# Patient Record
Sex: Female | Born: 1950
Health system: Southern US, Community
[De-identification: ages and names within clinical notes are randomized; demographics above are authoritative.]

## PROBLEM LIST (undated history)

## (undated) DIAGNOSIS — N952 Postmenopausal atrophic vaginitis: Secondary | ICD-10-CM

## (undated) DIAGNOSIS — B379 Candidiasis, unspecified: Principal | ICD-10-CM

## (undated) DIAGNOSIS — L292 Pruritus vulvae: Secondary | ICD-10-CM

## (undated) DIAGNOSIS — E785 Hyperlipidemia, unspecified: Secondary | ICD-10-CM

## (undated) DIAGNOSIS — L290 Pruritus ani: Secondary | ICD-10-CM

## (undated) DIAGNOSIS — K219 Gastro-esophageal reflux disease without esophagitis: Secondary | ICD-10-CM

## (undated) DIAGNOSIS — I10 Essential (primary) hypertension: Secondary | ICD-10-CM

## (undated) HISTORY — DX: Hyperlipidemia, unspecified: E78.5

## (undated) HISTORY — PX: BUNIONECTOMY: SHX129

## (undated) HISTORY — DX: Pruritus ani: L29.0

## (undated) HISTORY — DX: Postmenopausal atrophic vaginitis: N95.2

## (undated) HISTORY — DX: Essential (primary) hypertension: I10

## (undated) HISTORY — DX: Pruritus vulvae: L29.2

## (undated) HISTORY — PX: CYSTOSTOMY W/ BLADDER BIOPSY: SHX1431

## (undated) HISTORY — PX: DIAGNOSTIC LAPAROSCOPY: SUR761

## (undated) HISTORY — DX: Gastro-esophageal reflux disease without esophagitis: K21.9

## (undated) HISTORY — DX: Candidiasis, unspecified: B37.9

---

## 2000-09-24 ENCOUNTER — Encounter: Payer: Self-pay | Admitting: *Deleted

## 2000-09-24 ENCOUNTER — Ambulatory Visit (HOSPITAL_COMMUNITY): Admission: RE | Admit: 2000-09-24 | Discharge: 2000-09-24 | Payer: Self-pay | Admitting: *Deleted

## 2000-09-27 ENCOUNTER — Ambulatory Visit (HOSPITAL_COMMUNITY): Admission: RE | Admit: 2000-09-27 | Discharge: 2000-09-27 | Payer: Self-pay | Admitting: *Deleted

## 2000-09-27 ENCOUNTER — Encounter: Payer: Self-pay | Admitting: *Deleted

## 2001-02-02 ENCOUNTER — Ambulatory Visit (HOSPITAL_COMMUNITY): Admission: RE | Admit: 2001-02-02 | Discharge: 2001-02-02 | Payer: Self-pay | Admitting: *Deleted

## 2001-02-02 ENCOUNTER — Encounter: Payer: Self-pay | Admitting: Specialist

## 2001-03-02 ENCOUNTER — Other Ambulatory Visit: Admission: RE | Admit: 2001-03-02 | Discharge: 2001-03-02 | Payer: Self-pay | Admitting: Obstetrics and Gynecology

## 2002-05-16 ENCOUNTER — Ambulatory Visit (HOSPITAL_COMMUNITY): Admission: RE | Admit: 2002-05-16 | Discharge: 2002-05-16 | Payer: Self-pay | Admitting: Specialist

## 2002-05-16 ENCOUNTER — Encounter: Payer: Self-pay | Admitting: Specialist

## 2003-05-09 ENCOUNTER — Ambulatory Visit (HOSPITAL_COMMUNITY): Admission: RE | Admit: 2003-05-09 | Discharge: 2003-05-09 | Payer: Self-pay | Admitting: Family Medicine

## 2003-08-29 ENCOUNTER — Ambulatory Visit (HOSPITAL_COMMUNITY): Admission: RE | Admit: 2003-08-29 | Discharge: 2003-08-29 | Payer: Self-pay | Admitting: Specialist

## 2003-12-21 ENCOUNTER — Ambulatory Visit (HOSPITAL_COMMUNITY): Admission: RE | Admit: 2003-12-21 | Discharge: 2003-12-21 | Payer: Self-pay | Admitting: Internal Medicine

## 2003-12-31 ENCOUNTER — Ambulatory Visit (HOSPITAL_COMMUNITY): Admission: RE | Admit: 2003-12-31 | Discharge: 2003-12-31 | Payer: Self-pay | Admitting: Otolaryngology

## 2004-09-02 ENCOUNTER — Ambulatory Visit (HOSPITAL_COMMUNITY): Admission: RE | Admit: 2004-09-02 | Discharge: 2004-09-02 | Payer: Self-pay | Admitting: Internal Medicine

## 2005-01-07 ENCOUNTER — Ambulatory Visit (HOSPITAL_COMMUNITY): Admission: RE | Admit: 2005-01-07 | Discharge: 2005-01-07 | Payer: Self-pay | Admitting: Specialist

## 2005-10-30 ENCOUNTER — Ambulatory Visit: Payer: Self-pay | Admitting: Podiatry

## 2006-04-05 ENCOUNTER — Ambulatory Visit (HOSPITAL_COMMUNITY): Admission: RE | Admit: 2006-04-05 | Discharge: 2006-04-05 | Payer: Self-pay | Admitting: Obstetrics & Gynecology

## 2006-07-30 ENCOUNTER — Ambulatory Visit (HOSPITAL_COMMUNITY): Admission: RE | Admit: 2006-07-30 | Discharge: 2006-07-30 | Payer: Self-pay | Admitting: Internal Medicine

## 2006-09-14 ENCOUNTER — Ambulatory Visit (HOSPITAL_COMMUNITY): Admission: RE | Admit: 2006-09-14 | Discharge: 2006-09-14 | Payer: Self-pay | Admitting: Internal Medicine

## 2007-09-07 ENCOUNTER — Ambulatory Visit (HOSPITAL_COMMUNITY): Admission: RE | Admit: 2007-09-07 | Discharge: 2007-09-07 | Payer: Self-pay | Admitting: Internal Medicine

## 2007-12-23 ENCOUNTER — Ambulatory Visit (HOSPITAL_BASED_OUTPATIENT_CLINIC_OR_DEPARTMENT_OTHER): Admission: RE | Admit: 2007-12-23 | Discharge: 2007-12-23 | Payer: Self-pay | Admitting: Urology

## 2007-12-23 ENCOUNTER — Encounter (INDEPENDENT_AMBULATORY_CARE_PROVIDER_SITE_OTHER): Payer: Self-pay | Admitting: Urology

## 2008-04-30 ENCOUNTER — Other Ambulatory Visit: Admission: RE | Admit: 2008-04-30 | Discharge: 2008-04-30 | Payer: Self-pay | Admitting: Obstetrics and Gynecology

## 2008-10-17 ENCOUNTER — Ambulatory Visit (HOSPITAL_COMMUNITY): Admission: RE | Admit: 2008-10-17 | Discharge: 2008-10-17 | Payer: Self-pay | Admitting: Internal Medicine

## 2009-09-25 ENCOUNTER — Other Ambulatory Visit: Admission: RE | Admit: 2009-09-25 | Discharge: 2009-09-25 | Payer: Self-pay | Admitting: Obstetrics and Gynecology

## 2009-10-15 ENCOUNTER — Ambulatory Visit (HOSPITAL_COMMUNITY): Admission: RE | Admit: 2009-10-15 | Discharge: 2009-10-15 | Payer: Self-pay | Admitting: Internal Medicine

## 2009-10-22 ENCOUNTER — Ambulatory Visit (HOSPITAL_COMMUNITY): Admission: RE | Admit: 2009-10-22 | Discharge: 2009-10-22 | Payer: Self-pay | Admitting: Obstetrics & Gynecology

## 2010-04-06 ENCOUNTER — Encounter: Payer: Self-pay | Admitting: Obstetrics & Gynecology

## 2010-04-06 ENCOUNTER — Encounter: Payer: Self-pay | Admitting: Specialist

## 2010-07-29 NOTE — Op Note (Signed)
[name], [name]          ACCOUNT NO.:  [id number]   MEDICAL RECORD NO.:  [id number]          PATIENT TYPE:  AMB   LOCATION:  [facility]                         FACILITY:  [facility]   PHYSICIAN:  Ronald L. Earlene Plater, M.D.  DATE OF BIRTH:  01-Sep-1950   DATE OF PROCEDURE:  12/23/2007  DATE OF DISCHARGE:                               OPERATIVE REPORT   DIAGNOSIS:  Chronic cystitis bladder lesions.   OPERATIVE PROCEDURE:  Cystourethroscopy, bladder biopsy.   SURGEON:  Gaynelle Arabian, M.D.   ANESTHESIA:  LMA.   BLOOD LOSS:  Negligible to none.   COMPLICATIONS:  None.   PROCEDURE:  Maria Werner is a lovely 60 year old white female who has  had recurrent urinary tract infections for many years with exacerbations  recently.  They do not appeared to be stress-related or related to  intercourse.  She has no other high-risk activity.  She has suprapubic  pain relieved by voiding, but it is variable and she has been on  prophylactic Macrodantin since she was seen in office.  On cystourethroscopy, she had a cobblestone appearance of the trigone  and diffusely throughout the bladder. We are suspicious cystitis from  chronic infections, it  was felt biopsy was indicated.  After  understanding risks, benefits and alternatives, she has elected to  proceed with the above procedure.   The patient was placed in the supine position.  After proper LMA  anesthesia. she was placed in the dorsal lithotomy position and prepped  and draped with Betadine in a sterile fashion.  The urethra was  calibrated to 24-French.  It was quite tight.  Cystourethroscopy was  performed.  Efflux of clear urine was noted from the normally placed  ureteral orifices bilaterally.  There was areas of cystitis glandularis  and a cobblestone appearance of the trigone fairly extensively and  diffusely throughout the bladder, although not as much concentration as  the midline trigone.  Utilizing cold cup biopsy forceps, the area  was  biopsied and submitted to Pathology and the base was cauterized with  Bugbee coagulation cautery.  The bladder was drained.  The panendoscope  was removed and the patient was taken to the recovery room stable.      Ronald L. Earlene Plater, M.D.  Electronically Signed     RLD/MEDQ  D:  12/23/2007  T:  12/23/2007  Job:  [id number]

## 2010-08-01 NOTE — Consult Note (Signed)
NAMEPIYA, MESCH          ACCOUNT NO.:  [id number]   MEDICAL RECORD NO.:  [id number]           PATIENT TYPE:   LOCATION:                                 FACILITY:   PHYSICIAN:  Lionel December, M.D.         DATE OF BIRTH:   DATE OF CONSULTATION:  DATE OF DISCHARGE:                                   CONSULTATION   REQUESTING PHYSICIAN:  Cyril Mourning with Dr. Lazaro Arms.   REASON FOR CONSULTATION:  Hemoccult-positive stool.   HISTORY OF PRESENT ILLNESS:  Mairin is a 60 year old Caucasian female who  recently had her yearly physical and was found to have hemoccult-positive  stool on rectal examination.  She does have intermittent small volume  hematochezia, especially on toilet tissue when she is constipated.  She rare  sees this, however.  Generally, her bowels move regularly.  She denies any  melena, abdominal pain, nausea, vomiting, heartburn, dysphagia, odynophagia.   MEDICATIONS:  She is on aspirin 81 mg daily.  She has had a chronic cough  for which she has been on several allergy medications with no results.  She  is going to get an antibiotic today; however, she has not picked her  prescription up yet.   PAST MEDICAL HISTORY:  She has never had a colonoscopy.   CURRENT MEDICATIONS:  1.  Guaifenesin one teaspoon daily.  2.  Astelin nasal spray.  3.  Centrum Silver, one daily.  4.  Citracal.  5.  Magnesium daily.  6.  Lisinopril 10/12.5 mg daily.  7.  Clarinex 5 mg daily.  8.  Lipitor 5 mg daily.  9.  Detrol LA.  10. Bayer aspirin 81 mg daily.   ALLERGIES:  No known drug allergies.   PAST MEDICAL HISTORY:  1.  Hypercholesterolemia.  2.  Hypertension.  3.  Chronic cough.  4.  Status post cesarean section and diagnostic laparoscopy secondary to      infertility issues over 25 years ago.   FAMILY HISTORY:  Father had diabetes mellitus and a history of prostate  cancer.  No family history of colorectal cancer.   SOCIAL HISTORY:  She is married and  has two children.  She is employed at  [employer].  She quit smoking 25 years ago, but only smoked for three to four  years.  No alcohol use.   REVIEW OF SYSTEMS:  Please see HPI for GI.  CARDIOPULMONARY:  In addition,  denies any chest pain.  GENITOURINARY:  Denies any dysuria or hematuria.   PHYSICAL EXAMINATION:  VITAL SIGNS:  Weight 142, height 5 feet, 2 inches.  Blood pressure 130/80, pulse 96.  GENERAL:  A pleasant, well-nourished, well-developed Caucasian female, in no  acute distress.  SKIN:  Warm and dry, no jaundice.  HEENT:  Conjunctivae are pink.  Sclerae are nonicteric.  Oropharynx mucosa  moist and pink.  No lesions, erythema or exudate.  No lymphadenopathy or  thyromegaly.  CHEST:  Lungs are clear to auscultation.  CARDIAC:  Examination reveals normal rate and rhythm, normal S1 and S2.  No  murmurs, rubs or gallops.  ABDOMEN:  Positive bowel sounds, soft, nontender, nondistended.  No  organomegaly or masses.  EXTREMITIES:  No edema.  RECTAL:  Examination by Cyril Mourning revealed heme-positive stool.  Hemorrhoid.   IMPRESSION:  Llana is a 60 year old lady who has had occasional  intermittent small volume hematochezia and recently found to have hemoccult  positive stool on rectal examination.  She needs to have a further  evaluation to rule out colorectal cancer or colonic polyps.   I discussed the risks, alternatives and benefits with the patient, and she  is agreeable to proceed.   PLAN:  Colonoscopy in the near future.      ________________________________________  Tana Coast, P.A.  ___________________________________________  Lionel December, M.D.    LL/MEDQ  D:  12/05/2003  T:  12/06/2003  Job:  [id number]   cc:   Tilda Burrow, M.D.  [address] [place]  [place] [address]  Fax: [phone]   Lazaro Arms, M.D.  [address], [address]  [place]  [place] [address]  Fax: [phone]

## 2010-08-01 NOTE — Op Note (Signed)
Maria Werner, DESMITH          ACCOUNT NO.:  [id number]   MEDICAL RECORD NO.:  [id number]          PATIENT TYPE:  AMB   LOCATION:  DAY                           FACILITY:  [facility]   PHYSICIAN:  Lionel December, M.D.    DATE OF BIRTH:  [birth year]   DATE OF PROCEDURE:  12/21/2003  DATE OF DISCHARGE:                                 OPERATIVE REPORT   PROCEDURE:  Total colonoscopy.   INDICATIONS:  Capri is a 60 year old Caucasian female who was recently  noted to have heme-positive stools.  She does not have any GI symptoms other  than occasional hematochezia when she is constipated.  She is on low-dose  ASA but has not had any epigastric pain.  The procedure risks were reviewed  with the patient, and informed consent was obtained.   PREMEDICATION:  Demerol 50 mg IV, Versed 5 mg IV in divided dose.   FINDINGS:  Procedure performed in endoscopy suite.  The patient's vital  signs and O2 saturation were monitored during procedure and remained stable.  The patient was placed in the left lateral recumbent position and rectal  examination performed.  No abnormality noted on external or digital exam.  The Olympus video scope was placed in the rectum and advanced under vision  in the sigmoid colon and beyond.  Preparation was satisfactory.  The scope  was passed to the cecum, which was identified by ileocecal valve and  appendiceal orifice.  Pictures taken for the record.  As the scope was  withdrawn, the colonic mucosa was once again carefully examined and was  normal throughout.  Rectal mucosa similarly was normal.  The scope was  retroflexed to examine anorectal junction, which was unremarkable.  The  endoscope was straightened and withdrawn.  The patient tolerated the  procedure well.   FINAL DIAGNOSIS:  Normal colonoscopy.   RECOMMENDATIONS:  She will resume her usual diet and medications.  She will  have her Hemoccult at the time of her physical exam, which will be in the  early part  of next year.     Naje   NR/MEDQ  D:  12/21/2003  T:  12/21/2003  Job:  [id number]   cc:   Lazaro Arms, M.D.  [address]  [place]  [place] [address]  Fax: [phone]   Cyril Mourning  Office of Dr. Fredirick Maudlin, M.D.  [address]  [place]  [place] [address]  Fax: [phone]

## 2010-10-28 ENCOUNTER — Other Ambulatory Visit (HOSPITAL_COMMUNITY): Payer: Self-pay | Admitting: Internal Medicine

## 2010-10-31 ENCOUNTER — Ambulatory Visit (HOSPITAL_COMMUNITY)
Admission: RE | Admit: 2010-10-31 | Discharge: 2010-10-31 | Disposition: A | Discharge status: Home / Self Care | Payer: BC Managed Care – PPO | Source: Ambulatory Visit | Attending: Internal Medicine | Admitting: Internal Medicine

## 2010-10-31 DIAGNOSIS — M818 Other osteoporosis without current pathological fracture: Secondary | ICD-10-CM | POA: Insufficient documentation

## 2010-10-31 DIAGNOSIS — Z780 Asymptomatic menopausal state: Secondary | ICD-10-CM | POA: Insufficient documentation

## 2010-11-19 ENCOUNTER — Other Ambulatory Visit: Payer: Self-pay | Admitting: Adult Health

## 2010-11-19 DIAGNOSIS — Z139 Encounter for screening, unspecified: Secondary | ICD-10-CM

## 2010-11-25 ENCOUNTER — Ambulatory Visit (HOSPITAL_COMMUNITY)
Admission: RE | Admit: 2010-11-25 | Discharge: 2010-11-25 | Disposition: A | Discharge status: Home / Self Care | Payer: BC Managed Care – PPO | Source: Ambulatory Visit | Attending: Adult Health | Admitting: Adult Health

## 2010-11-25 DIAGNOSIS — Z139 Encounter for screening, unspecified: Secondary | ICD-10-CM

## 2010-11-25 DIAGNOSIS — Z1231 Encounter for screening mammogram for malignant neoplasm of breast: Secondary | ICD-10-CM | POA: Insufficient documentation

## 2010-12-16 LAB — POCT I-STAT 4, (NA,K, GLUC, HGB,HCT): Sodium: 139

## 2011-01-06 ENCOUNTER — Other Ambulatory Visit (HOSPITAL_COMMUNITY)
Admission: RE | Admit: 2011-01-06 | Discharge: 2011-01-06 | Disposition: A | Discharge status: Home / Self Care | Payer: BC Managed Care – PPO | Source: Ambulatory Visit | Attending: Obstetrics and Gynecology | Admitting: Obstetrics and Gynecology

## 2011-01-06 ENCOUNTER — Other Ambulatory Visit: Payer: Self-pay | Admitting: Adult Health

## 2011-01-06 DIAGNOSIS — Z01419 Encounter for gynecological examination (general) (routine) without abnormal findings: Secondary | ICD-10-CM | POA: Insufficient documentation

## 2011-12-02 ENCOUNTER — Other Ambulatory Visit (HOSPITAL_COMMUNITY): Payer: Self-pay | Admitting: Internal Medicine

## 2011-12-02 DIAGNOSIS — Z139 Encounter for screening, unspecified: Secondary | ICD-10-CM

## 2011-12-08 ENCOUNTER — Ambulatory Visit (HOSPITAL_COMMUNITY): Payer: BC Managed Care – PPO

## 2011-12-14 ENCOUNTER — Ambulatory Visit (HOSPITAL_COMMUNITY)
Admission: RE | Admit: 2011-12-14 | Discharge: 2011-12-14 | Disposition: A | Discharge status: Home / Self Care | Payer: BC Managed Care – PPO | Source: Ambulatory Visit | Attending: Internal Medicine | Admitting: Internal Medicine

## 2011-12-14 DIAGNOSIS — Z139 Encounter for screening, unspecified: Secondary | ICD-10-CM

## 2011-12-14 DIAGNOSIS — Z1231 Encounter for screening mammogram for malignant neoplasm of breast: Secondary | ICD-10-CM | POA: Insufficient documentation

## 2012-09-28 ENCOUNTER — Ambulatory Visit (INDEPENDENT_AMBULATORY_CARE_PROVIDER_SITE_OTHER): Payer: BC Managed Care – PPO | Admitting: Adult Health

## 2012-09-28 ENCOUNTER — Encounter: Payer: Self-pay | Admitting: Adult Health

## 2012-09-28 VITALS — BP 140/80 | Ht 62.5 in | Wt 155.8 lb

## 2012-09-28 DIAGNOSIS — B379 Candidiasis, unspecified: Secondary | ICD-10-CM

## 2012-09-28 DIAGNOSIS — N952 Postmenopausal atrophic vaginitis: Secondary | ICD-10-CM

## 2012-09-28 DIAGNOSIS — L293 Anogenital pruritus, unspecified: Secondary | ICD-10-CM

## 2012-09-28 DIAGNOSIS — N898 Other specified noninflammatory disorders of vagina: Secondary | ICD-10-CM

## 2012-09-28 HISTORY — DX: Postmenopausal atrophic vaginitis: N95.2

## 2012-09-28 HISTORY — DX: Candidiasis, unspecified: B37.9

## 2012-09-28 LAB — POCT WET PREP (WET MOUNT)

## 2012-09-28 MED ORDER — NYSTATIN-TRIAMCINOLONE 100000-0.1 UNIT/GM-% EX CREA: TOPICAL_CREAM | Freq: Three times a day (TID) | CUTANEOUS | Status: DC

## 2012-09-28 MED ORDER — FLUCONAZOLE 150 MG PO TABS: 150.0000 mg | ORAL_TABLET | Freq: Once | ORAL | Status: DC

## 2012-09-28 NOTE — Progress Notes (Signed)
Subjective:     Patient ID: Gunnar Bulla, female   DOB: [birth year], 62 y.o.   MRN: [id number]  HPI Skai is a 62 year old white female in complaining of vaginal itching, she took antibiotics recently for a UTI.  Review of Systems Positives in HPI Reviewed past medical,surgical, social and family history. Reviewed medications and allergies.     Objective:   Physical Exam BP 140/80  Ht 5' 2.5" (1.588 m)  Wt 155 lb 12 oz (70.648 kg)  BMI 28.02 kg/m2   Skin warm and dry.Pelvic: external genitalia is normal in appearance for age, vagina: white discharge without odor and is atrophic in appearance, cervix:smooth , uterus: normal size, shape and contour, non tender, no masses felt, adnexa: no masses or tenderness noted. Wet prep: + for yeast.  Assessment:      Yeast  Vaginal discharge Atrophic vaginal tissue    Plan:      Rx diflucan 150 mg 1 now  Rx Mytrex cream to use 2-3 x daily prn  Try luvena for vaginal moisture Follow up prn

## 2012-09-28 NOTE — Patient Instructions (Addendum)
Follow up prn Monilial Vaginitis Vaginitis in a soreness, swelling and redness (inflammation) of the vagina and vulva. Monilial vaginitis is not a sexually transmitted infection. CAUSES  Yeast vaginitis is caused by yeast (candida) that is normally found in your vagina. With a yeast infection, the candida has overgrown in number to a point that upsets the chemical balance. SYMPTOMS   White, thick vaginal discharge.  Swelling, itching, redness and irritation of the vagina and possibly the lips of the vagina (vulva).  Burning or painful urination.  Painful intercourse. DIAGNOSIS  Things that may contribute to monilial vaginitis are:  Postmenopausal and virginal states.  Pregnancy.  Infections.  Being tired, sick or stressed, especially if you had monilial vaginitis in the past.  Diabetes. Good control will help lower the chance.  Birth control pills.  Tight fitting garments.  Using bubble bath, feminine sprays, douches or deodorant tampons.  Taking certain medications that kill germs (antibiotics).  Sporadic recurrence can occur if you become ill. TREATMENT  Your caregiver will give you medication.  There are several kinds of anti monilial vaginal creams and suppositories specific for monilial vaginitis. For recurrent yeast infections, use a suppository or cream in the vagina 2 times a week, or as directed.  Anti-monilial or steroid cream for the itching or irritation of the vulva may also be used. Get your caregiver's permission.  Painting the vagina with methylene blue solution may help if the monilial cream does not work.  Eating yogurt may help prevent monilial vaginitis. HOME CARE INSTRUCTIONS   Finish all medication as prescribed.  Do not have sex until treatment is completed or after your caregiver tells you it is okay.  Take warm sitz baths.  Do not douche.  Do not use tampons, especially scented ones.  Wear cotton underwear.  Avoid tight pants and  panty hose.  Tell your sexual partner that you have a yeast infection. They should go to their caregiver if they have symptoms such as mild rash or itching.  Your sexual partner should be treated as well if your infection is difficult to eliminate.  Practice safer sex. Use condoms.  Some vaginal medications cause latex condoms to fail. Vaginal medications that harm condoms are:  Cleocin cream.  Butoconazole (Femstat).  Terconazole (Terazol) vaginal suppository.  Miconazole (Monistat) (may be purchased over the counter). SEEK MEDICAL CARE IF:   You have a temperature by mouth above 102 F (38.9 C).  The infection is getting worse after 2 days of treatment.  The infection is not getting better after 3 days of treatment.  You develop blisters in or around your vagina.  You develop vaginal bleeding, and it is not your menstrual period.  You have pain when you urinate.  You develop intestinal problems.  You have pain with sexual intercourse. Document Released: 12/10/2004 Document Revised: 05/25/2011 Document Reviewed: 08/24/2008 [facility] Patient Information 2014 [place], [place].

## 2012-12-01 ENCOUNTER — Other Ambulatory Visit: Payer: Self-pay | Admitting: Adult Health

## 2012-12-01 DIAGNOSIS — Z139 Encounter for screening, unspecified: Secondary | ICD-10-CM

## 2012-12-15 ENCOUNTER — Ambulatory Visit (HOSPITAL_COMMUNITY)
Admission: RE | Admit: 2012-12-15 | Discharge: 2012-12-15 | Disposition: A | Discharge status: Home / Self Care | Payer: BC Managed Care – PPO | Source: Ambulatory Visit | Attending: Adult Health | Admitting: Adult Health

## 2012-12-15 DIAGNOSIS — Z1231 Encounter for screening mammogram for malignant neoplasm of breast: Secondary | ICD-10-CM | POA: Insufficient documentation

## 2012-12-15 DIAGNOSIS — Z139 Encounter for screening, unspecified: Secondary | ICD-10-CM

## 2013-01-09 ENCOUNTER — Ambulatory Visit (INDEPENDENT_AMBULATORY_CARE_PROVIDER_SITE_OTHER): Payer: BC Managed Care – PPO | Admitting: Adult Health

## 2013-01-09 ENCOUNTER — Encounter: Payer: Self-pay | Admitting: Adult Health

## 2013-01-09 ENCOUNTER — Other Ambulatory Visit (HOSPITAL_COMMUNITY)
Admission: RE | Admit: 2013-01-09 | Discharge: 2013-01-09 | Disposition: A | Discharge status: Home / Self Care | Payer: BC Managed Care – PPO | Source: Ambulatory Visit | Attending: Adult Health | Admitting: Adult Health

## 2013-01-09 VITALS — BP 122/76 | HR 72 | Ht 62.0 in | Wt 153.0 lb

## 2013-01-09 DIAGNOSIS — Z01419 Encounter for gynecological examination (general) (routine) without abnormal findings: Secondary | ICD-10-CM

## 2013-01-09 DIAGNOSIS — Z1151 Encounter for screening for human papillomavirus (HPV): Secondary | ICD-10-CM | POA: Insufficient documentation

## 2013-01-09 NOTE — Progress Notes (Signed)
Patient ID: Maria Werner, female   DOB: 10/12/1950, 62 y.o.   MRN: [id number] History of Present Illness: Maria Werner is a 62 year old white female married in for a pap and physical.No complaints today.   Current Medications, Allergies, Past Medical History, Past Surgical History, Family History and Social History were reviewed in [employer] record.   Past Medical History  Diagnosis Date  . Hypertension   . GERD (gastroesophageal reflux disease)   . Hyperlipidemia   . Yeast infection 09/28/2012  . Post-menopausal atrophic vaginitis 09/28/2012  Current outpatient prescriptions:aspirin 81 MG tablet, Take 81 mg by mouth every other day., Disp: , Rfl: ;  atorvastatin (LIPITOR) 10 MG tablet, Take 10 mg by mouth at bedtime., Disp: , Rfl: ;  hydrochlorothiazide (HYDRODIURIL) 25 MG tablet, Take 25 mg by mouth daily. , Disp: , Rfl:  ibandronate (BONIVA) 150 MG tablet, Take 150 mg by mouth every 30 (thirty) days. Take in the morning with a full glass of water, on an empty stomach, and do not take anything else by mouth or lie down for the next 30 min., Disp: , Rfl: ;  KLOR-CON M20 20 MEQ tablet, Take 20 mEq by mouth daily. , Disp: , Rfl: ;  losartan (COZAAR) 50 MG tablet, Take 50 mg by mouth daily. , Disp: , Rfl:  Multiple Vitamins-Minerals (CENTRUM SILVER PO), Take 1 tablet by mouth daily., Disp: , Rfl: ;  omeprazole (PRILOSEC) 20 MG capsule, Take 20 mg by mouth daily. , Disp: , Rfl: ;  fluconazole (DIFLUCAN) 150 MG tablet, Take 1 tablet (150 mg total) by mouth once., Disp: 2 tablet, Rfl: 1;  nystatin-triamcinolone (MYCOLOG II) cream, Apply topically 3 (three) times daily., Disp: 30 g, Rfl: 1  Review of Systems: Patient denies any headaches, blurred vision, shortness of breath, chest pain, abdominal pain, problems with bowel movements, urination, or intercourse, except dry. No joint swelling or mood changes.    Physical Exam:BP 122/76  Pulse 72  Ht 5\' 2"  (1.575 m)  Wt 153 lb  (69.4 kg)  BMI 27.98 kg/m2 General:  Well developed, well nourished, no acute distress Skin:  Warm and dry,tan Neck:  Midline trachea, normal thyroid Lungs; Clear to auscultation bilaterally Breast:  No dominant palpable mass, retraction, or nipple discharge Cardiovascular: Regular rate and rhythm Abdomen:  Soft, non tender, no hepatosplenomegaly Pelvic:  External genitalia is normal in appearance.  The vagina is normal in appearance for age.               The cervix is atrophic, pap with HPV.  Uterus is felt to be normal size, shape, and contour.  No                adnexal masses or tenderness noted. Rectal: Good sphincter tone, no polyps, or hemorrhoids felt.  Hemoccult negative. Extremities:  No swelling or varicosities noted Psych:  No mood changes, alert and cooperative   Impression: Yearly gyn exam    Plan: Physical in 1 year Mammogram yearly Colonoscopy 2015 Labs with PCP Pap in 3 years if this one normal. Call prn problems

## 2013-01-09 NOTE — Patient Instructions (Signed)
Physical in 1 year Mammogram yearly  colonoscopy 2015

## 2014-01-15 ENCOUNTER — Encounter: Payer: Self-pay | Admitting: Adult Health

## 2014-01-23 ENCOUNTER — Other Ambulatory Visit: Payer: Self-pay | Admitting: Adult Health

## 2014-01-23 DIAGNOSIS — Z1231 Encounter for screening mammogram for malignant neoplasm of breast: Secondary | ICD-10-CM

## 2014-01-29 ENCOUNTER — Ambulatory Visit (HOSPITAL_COMMUNITY)
Admission: RE | Admit: 2014-01-29 | Discharge: 2014-01-29 | Disposition: A | Discharge status: Home / Self Care | Payer: BC Managed Care – PPO | Source: Ambulatory Visit | Attending: Adult Health | Admitting: Adult Health

## 2014-01-29 DIAGNOSIS — Z1231 Encounter for screening mammogram for malignant neoplasm of breast: Secondary | ICD-10-CM | POA: Diagnosis not present

## 2014-01-30 ENCOUNTER — Encounter (INDEPENDENT_AMBULATORY_CARE_PROVIDER_SITE_OTHER): Payer: Self-pay | Admitting: *Deleted

## 2014-02-13 HISTORY — PX: CARPAL TUNNEL RELEASE: SHX101

## 2014-05-24 ENCOUNTER — Encounter (INDEPENDENT_AMBULATORY_CARE_PROVIDER_SITE_OTHER): Payer: Self-pay | Admitting: *Deleted

## 2014-05-24 ENCOUNTER — Other Ambulatory Visit (INDEPENDENT_AMBULATORY_CARE_PROVIDER_SITE_OTHER): Payer: Self-pay | Admitting: *Deleted

## 2014-05-24 DIAGNOSIS — Z1211 Encounter for screening for malignant neoplasm of colon: Secondary | ICD-10-CM

## 2014-06-15 ENCOUNTER — Telehealth (INDEPENDENT_AMBULATORY_CARE_PROVIDER_SITE_OTHER): Payer: Self-pay | Admitting: *Deleted

## 2014-06-15 DIAGNOSIS — Z1211 Encounter for screening for malignant neoplasm of colon: Secondary | ICD-10-CM

## 2014-06-15 MED ORDER — PEG 3350-KCL-NA BICARB-NACL 420 G PO SOLR: 4000.0000 mL | Freq: Once | ORAL | Status: DC

## 2014-06-15 NOTE — Telephone Encounter (Signed)
Patient needs trilyte 

## 2014-07-03 ENCOUNTER — Telehealth (INDEPENDENT_AMBULATORY_CARE_PROVIDER_SITE_OTHER): Payer: Self-pay | Admitting: *Deleted

## 2014-07-03 NOTE — Telephone Encounter (Signed)
agree

## 2014-07-03 NOTE — Telephone Encounter (Signed)
Referring MD/PCP: fagan   Procedure: tcs  Reason/Indication:  screening  Has patient had this procedure before?  Yes, 2005 -- epic  If so, when, by whom and where?    Is there a family history of colon cancer?  no  Who?  What age when diagnosed?    Is patient diabetic?   no      Does patient have prosthetic heart valve?  no  Do you have a pacemaker?  no  Has patient ever had endocarditis? no  Has patient had joint replacement within last 12 months?  no  Does patient tend to be constipated or take laxatives? no  Is patient on Coumadin, Plavix and/or Aspirin? yes  Medications: asa 81 mg daily, losartan 50 mg daily, potassium 20 meq daily, omeprazole 20 mg daily, atorvastatin 20 mg daily, hctz 25 mg daily, vit c, centrum silver  Allergies: nkda  Medication Adjustment: asa 2 days  Procedure date & time: 08/01/14 at 930

## 2014-08-01 ENCOUNTER — Encounter (HOSPITAL_COMMUNITY): Payer: Self-pay | Admitting: *Deleted

## 2014-08-01 ENCOUNTER — Encounter (HOSPITAL_COMMUNITY)
Admission: RE | Disposition: A | Discharge status: Home / Self Care | Payer: Self-pay | Source: Ambulatory Visit | Attending: Internal Medicine

## 2014-08-01 ENCOUNTER — Ambulatory Visit (HOSPITAL_COMMUNITY)
Admission: RE | Admit: 2014-08-01 | Discharge: 2014-08-01 | Disposition: A | Discharge status: Home / Self Care | Payer: Commercial Managed Care - HMO | Source: Ambulatory Visit | Attending: Internal Medicine | Admitting: Internal Medicine

## 2014-08-01 DIAGNOSIS — D125 Benign neoplasm of sigmoid colon: Secondary | ICD-10-CM | POA: Insufficient documentation

## 2014-08-01 DIAGNOSIS — Z79899 Other long term (current) drug therapy: Secondary | ICD-10-CM | POA: Diagnosis not present

## 2014-08-01 DIAGNOSIS — D123 Benign neoplasm of transverse colon: Secondary | ICD-10-CM | POA: Diagnosis not present

## 2014-08-01 DIAGNOSIS — Z87891 Personal history of nicotine dependence: Secondary | ICD-10-CM | POA: Insufficient documentation

## 2014-08-01 DIAGNOSIS — Z7982 Long term (current) use of aspirin: Secondary | ICD-10-CM | POA: Diagnosis not present

## 2014-08-01 DIAGNOSIS — K5730 Diverticulosis of large intestine without perforation or abscess without bleeding: Secondary | ICD-10-CM | POA: Insufficient documentation

## 2014-08-01 DIAGNOSIS — Z1211 Encounter for screening for malignant neoplasm of colon: Secondary | ICD-10-CM | POA: Diagnosis not present

## 2014-08-01 DIAGNOSIS — E785 Hyperlipidemia, unspecified: Secondary | ICD-10-CM | POA: Diagnosis not present

## 2014-08-01 DIAGNOSIS — I10 Essential (primary) hypertension: Secondary | ICD-10-CM | POA: Insufficient documentation

## 2014-08-01 DIAGNOSIS — K219 Gastro-esophageal reflux disease without esophagitis: Secondary | ICD-10-CM | POA: Insufficient documentation

## 2014-08-01 HISTORY — PX: COLONOSCOPY: SHX5424

## 2014-08-01 SURGERY — COLONOSCOPY
Anesthesia: Moderate Sedation

## 2014-08-01 MED ORDER — STERILE WATER FOR IRRIGATION IR SOLN
Status: DC | PRN
  Administered 2014-08-01: 09:00:00

## 2014-08-01 MED ORDER — MEPERIDINE HCL 50 MG/ML IJ SOLN
INTRAMUSCULAR | Status: AC
  Filled 2014-08-01: qty 1

## 2014-08-01 MED ORDER — MIDAZOLAM HCL 5 MG/5ML IJ SOLN
INTRAMUSCULAR | Status: DC | PRN
  Administered 2014-08-01: 1 mg via INTRAVENOUS
  Administered 2014-08-01: 2 mg via INTRAVENOUS
  Administered 2014-08-01 (×2): 1 mg via INTRAVENOUS
  Administered 2014-08-01: 2 mg via INTRAVENOUS
  Administered 2014-08-01: 1 mg via INTRAVENOUS
  Administered 2014-08-01: 2 mg via INTRAVENOUS

## 2014-08-01 MED ORDER — MEPERIDINE HCL 50 MG/ML IJ SOLN
INTRAMUSCULAR | Status: DC | PRN
  Administered 2014-08-01 (×2): 25 mg via INTRAVENOUS

## 2014-08-01 MED ORDER — SODIUM CHLORIDE 0.9 % IV SOLN
INTRAVENOUS | Status: DC
  Administered 2014-08-01: 09:00:00 via INTRAVENOUS

## 2014-08-01 MED ORDER — MIDAZOLAM HCL 5 MG/5ML IJ SOLN
INTRAMUSCULAR | Status: AC
  Filled 2014-08-01: qty 10

## 2014-08-01 NOTE — Op Note (Signed)
COLONOSCOPY PROCEDURE REPORT  PATIENT:  Maria Werner  MR#:  [id number] Birthdate:  10/14/1950, 64 y.o., female Endoscopist:  Dr. Rogene Houston, MD Referred By:  Dr. Asencion Noble, MD Procedure Date: 08/01/2014  Procedure:   Colonoscopy  Indications:  Patient is 64 year old Caucasian female was undergoing average risk screening colonoscopy. Her last exam was normal in October 2005.  Informed Consent:  The procedure and risks were reviewed with the patient and informed consent was obtained.  Medications:  Demerol 50 mg IV Versed 10 mg IV  Description of procedure:  After a digital rectal exam was performed, that colonoscope was advanced from the anus through the rectum and colon to the area of the cecum, ileocecal valve and appendiceal orifice. The cecum was deeply intubated. These structures were well-seen and photographed for the record. From the level of the cecum and ileocecal valve, the scope was slowly and cautiously withdrawn. The mucosal surfaces were carefully surveyed utilizing scope tip to flexion to facilitate fold flattening as needed. The scope was pulled down into the rectum where a thorough exam including retroflexion was performed.  Findings:   Prep excellent. Small polyp was ablated via cold biopsy from transverse colon and submitted with another small polyp that was removed with cold biopsy forceps distal sigmoid colon. 5 mm polyp cold snare from mid sigmoid colon. Few small diverticula at sigmoid colon. Normal rectal mucosa and anal rectal junction.   Therapeutic/Diagnostic Maneuvers Performed:  See above  Complications:  None  Cecal Withdrawal Time:  25  minutes  Impression:  Examination performed to cecum. Two small polyps ablated via cold biopsy and submitted together(transverse and sigmoid colon). 5 mm polyp cold snare from mid sigmoid colon. Mild sigmoid colon diverticulosis.  Recommendations:  Standard instructions given. I will contact patient with  biopsy results and further recommendations.  Aluel Schwarz U  08/01/2014 10:25 AM  CC: Dr. Asencion Noble, MD & Dr. Rayne Du ref. provider found

## 2014-08-01 NOTE — H&P (Signed)
Maria Werner is an 64 y.o. female.   Chief Complaint: Patient is here for colonoscopy. HPI: Patient is 64 year old Caucasian female who is here for screening colonoscopy. Last exam was in October 2005 and was normal. She denies abdominal pain change in bowel habits or rectal bleeding. Family  history is negative for CRC.  Past Medical History  Diagnosis Date  . Hypertension   . GERD (gastroesophageal reflux disease)   . Hyperlipidemia   . Yeast infection 09/28/2012  . Post-menopausal atrophic vaginitis 09/28/2012    Past Surgical History  Procedure Laterality Date  . Cesarean section    . Bunionectomy    . Cystostomy w/ bladder biopsy    . Diagnostic laparoscopy      Family History  Problem Relation Age of Onset  . COPD Mother     emphysema  . Heart disease Father   . Cancer Father     prostate  . Diabetes Father   . Diabetes Sister   . Cancer - Colon Neg Hx    Social History:  reports that she has quit smoking. She has never used smokeless tobacco. She reports that she does not drink alcohol or use illicit drugs.  Allergies: No Known Allergies  Medications Prior to Admission  Medication Sig Dispense Refill  . aspirin EC 81 MG tablet Take 81 mg by mouth daily.    Marland Kitchen atorvastatin (LIPITOR) 10 MG tablet Take 10 mg by mouth at bedtime.    . hydrochlorothiazide (HYDRODIURIL) 25 MG tablet Take 25 mg by mouth daily.     Marland Kitchen KLOR-CON M20 20 MEQ tablet Take 20 mEq by mouth daily.     Marland Kitchen losartan (COZAAR) 50 MG tablet Take 50 mg by mouth daily.     . Multiple Vitamins-Minerals (CENTRUM SILVER PO) Take 1 tablet by mouth daily.    Marland Kitchen omeprazole (PRILOSEC) 20 MG capsule Take 20 mg by mouth daily.     . polyethylene glycol-electrolytes (NULYTELY/GOLYTELY) 420 G solution Take 4,000 mLs by mouth once. 4000 mL 0  . fluconazole (DIFLUCAN) 150 MG tablet Take 1 tablet (150 mg total) by mouth once. (Patient not taking: Reported on 07/27/2014) 2 tablet 1  . ibandronate (BONIVA) 150 MG tablet  Take 150 mg by mouth every 30 (thirty) days. Take in the morning with a full glass of water, on an empty stomach, and do not take anything else by mouth or lie down for the next 30 min.    . nystatin-triamcinolone (MYCOLOG II) cream Apply topically 3 (three) times daily. (Patient not taking: Reported on 07/27/2014) 30 g 1    No results found for this or any previous visit (from the past 48 hour(s)). No results found.  ROS  Blood pressure 133/72, pulse 83, temperature 97.6 F (36.4 C), temperature source Oral, resp. rate 16, height 5\' 2"  (1.575 m), weight 154 lb (69.854 kg), SpO2 96 %. Physical Exam  Constitutional: She appears well-developed and well-nourished.  HENT:  Mouth/Throat: Oropharynx is clear and moist.  Eyes: Conjunctivae are normal. No scleral icterus.  Neck: No thyromegaly present.  Cardiovascular: Normal rate, regular rhythm and normal heart sounds.   No murmur heard. Respiratory: Effort normal and breath sounds normal.  GI: Soft. She exhibits no distension and no mass. There is no tenderness.  Musculoskeletal: She exhibits no edema.  Lymphadenopathy:    She has no cervical adenopathy.  Neurological: She is alert.  Skin: Skin is warm and dry.     Assessment/Plan Average risk screening colonoscopy.  Angelo Caroll  U 08/01/2014, 9:26 AM

## 2014-08-01 NOTE — Discharge Instructions (Signed)
Resume usual medications and high fiber diet. °No driving for 24 hours. °Physician will call with biopsy results ° ° °High-Fiber Diet °Fiber is found in fruits, vegetables, and grains. A high-fiber diet encourages the addition of more whole grains, legumes, fruits, and vegetables in your diet. The recommended amount of fiber for adult males is 38 g per day. For adult females, it is 25 g per day. Pregnant and lactating women should get 28 g of fiber per day. If you have a digestive or bowel problem, ask your caregiver for advice before adding high-fiber foods to your diet. Eat a variety of high-fiber foods instead of only a select few type of foods.  °PURPOSE °· To increase stool bulk. °· To make bowel movements more regular to prevent constipation. °· To lower cholesterol. °· To prevent overeating. °WHEN IS THIS DIET USED? °· It may be used if you have constipation and hemorrhoids. °· It may be used if you have uncomplicated diverticulosis (intestine condition) and irritable bowel syndrome. °· It may be used if you need help with weight management. °· It may be used if you want to add it to your diet as a protective measure against atherosclerosis, diabetes, and cancer. °SOURCES OF FIBER °· Whole-grain breads and cereals. °· Fruits, such as apples, oranges, bananas, berries, prunes, and pears. °· Vegetables, such as green peas, carrots, sweet potatoes, beets, broccoli, cabbage, spinach, and artichokes. °· Legumes, such split peas, soy, lentils. °· Almonds. °FIBER CONTENT IN FOODS °Starches and Grains / Dietary Fiber (g) °· Cheerios, 1 cup / 3 g °· Corn Flakes cereal, 1 cup / 0.7 g °· Rice crispy treat cereal, 1¼ cup / 0.3 g °· Instant oatmeal (cooked), ½ cup / 2 g °· Frosted wheat cereal, 1 cup / 5.1 g °· Brown, long-grain rice (cooked), 1 cup / 3.5 g °· White, long-grain rice (cooked), 1 cup / 0.6 g °· Enriched macaroni (cooked), 1 cup / 2.5 g °Legumes / Dietary Fiber (g) °· Baked beans (canned, plain, or  vegetarian), ½ cup / 5.2 g °· Kidney beans (canned), ½ cup / 6.8 g °· Pinto beans (cooked), ½ cup / 5.5 g °Breads and Crackers / Dietary Fiber (g) °· Plain or honey graham crackers, 2 squares / 0.7 g °· Saltine crackers, 3 squares / 0.3 g °· Plain, salted pretzels, 10 pieces / 1.8 g °· Whole-wheat bread, 1 slice / 1.9 g °· White bread, 1 slice / 0.7 g °· Raisin bread, 1 slice / 1.2 g °· Plain bagel, 3 oz / 2 g °· Flour tortilla, 1 oz / 0.9 g °· Corn tortilla, 1 small / 1.5 g °· Hamburger or hotdog bun, 1 small / 0.9 g °Fruits / Dietary Fiber (g) °· Apple with skin, 1 medium / 4.4 g °· Sweetened applesauce, ½ cup / 1.5 g °· Banana, ½ medium / 1.5 g °· Grapes, 10 grapes / 0.4 g °· Orange, 1 small / 2.3 g °· Raisin, 1.5 oz / 1.6 g °· Melon, 1 cup / 1.4 g °Vegetables / Dietary Fiber (g) °· Green beans (canned), ½ cup / 1.3 g °· Carrots (cooked), ½ cup / 2.3 g °· Broccoli (cooked), ½ cup / 2.8 g °· Peas (cooked), ½ cup / 4.4 g °· Mashed potatoes, ½ cup / 1.6 g °· Lettuce, 1 cup / 0.5 g °· Corn (canned), ½ cup / 1.6 g °· Tomato, ½ cup / 1.1 g °Document Released: 03/02/2005 Document Revised: 09/01/2011 Document Reviewed: 06/04/2011 °ExitCare® Patient   Information 2015 [place], [place]. This information is not intended to replace advice given to you by your health care provider. Make sure you discuss any questions you have with your health care provider. Colonoscopy A colonoscopy is an exam to look at the entire large intestine (colon). This exam can help find problems such as tumors, polyps, inflammation, and areas of bleeding. The exam takes about 1 hour.  LET [facility] CARE PROVIDER KNOW ABOUT:   Any allergies you have.  All medicines you are taking, including vitamins, herbs, eye drops, creams, and over-the-counter medicines.  Previous problems you or members of your family have had with the use of anesthetics.  Any blood disorders you have.  Previous surgeries you have had.  Medical conditions you  have. RISKS AND COMPLICATIONS  Generally, this is a safe procedure. However, as with any procedure, complications can occur. Possible complications include:  Bleeding.  Tearing or rupture of the colon wall.  Reaction to medicines given during the exam.  Infection (rare). BEFORE THE PROCEDURE   Ask your health care provider about changing or stopping your regular medicines.  You may be prescribed an oral bowel prep. This involves drinking a large amount of medicated liquid, starting the day before your procedure. The liquid will cause you to have multiple loose stools until your stool is almost clear or light green. This cleans out your colon in preparation for the procedure.  Do not eat or drink anything else once you have started the bowel prep, unless your health care provider tells you it is safe to do so.  Arrange for someone to drive you home after the procedure. PROCEDURE   You will be given medicine to help you relax (sedative).  You will lie on your side with your knees bent.  A long, flexible tube with a light and camera on the end (colonoscope) will be inserted through the rectum and into the colon. The camera sends video back to a computer screen as it moves through the colon. The colonoscope also releases carbon dioxide gas to inflate the colon. This helps your health care provider see the area better.  During the exam, your health care provider may take a small tissue sample (biopsy) to be examined under a microscope if any abnormalities are found.  The exam is finished when the entire colon has been viewed. AFTER THE PROCEDURE   Do not drive for 24 hours after the exam.  You may have a small amount of blood in your stool.  You may pass moderate amounts of gas and have mild abdominal cramping or bloating. This is caused by the gas used to inflate your colon during the exam.  Ask when your test results will be ready and how you will get your results. Make sure you  get your test results. Document Released: 02/28/2000 Document Revised: 12/21/2012 Document Reviewed: 11/07/2012 [facility] Patient Information 2015 [place], [place]. This information is not intended to replace advice given to you by your health care provider. Make sure you discuss any questions you have with your health care provider.

## 2014-08-02 ENCOUNTER — Encounter (HOSPITAL_COMMUNITY): Payer: Self-pay | Admitting: Internal Medicine

## 2014-08-06 ENCOUNTER — Encounter (INDEPENDENT_AMBULATORY_CARE_PROVIDER_SITE_OTHER): Payer: Self-pay | Admitting: *Deleted

## 2015-01-16 ENCOUNTER — Other Ambulatory Visit: Payer: Self-pay | Admitting: Adult Health

## 2015-01-29 ENCOUNTER — Other Ambulatory Visit: Payer: Self-pay | Admitting: Adult Health

## 2015-01-29 DIAGNOSIS — Z1231 Encounter for screening mammogram for malignant neoplasm of breast: Secondary | ICD-10-CM

## 2015-01-30 ENCOUNTER — Telehealth: Payer: Self-pay | Admitting: Adult Health

## 2015-01-30 ENCOUNTER — Encounter: Payer: Self-pay | Admitting: Adult Health

## 2015-01-30 ENCOUNTER — Other Ambulatory Visit (HOSPITAL_COMMUNITY)
Admission: RE | Admit: 2015-01-30 | Discharge: 2015-01-30 | Disposition: A | Discharge status: Home / Self Care | Payer: Commercial Managed Care - HMO | Source: Ambulatory Visit | Attending: Adult Health | Admitting: Adult Health

## 2015-01-30 ENCOUNTER — Ambulatory Visit (INDEPENDENT_AMBULATORY_CARE_PROVIDER_SITE_OTHER): Payer: Commercial Managed Care - HMO | Admitting: Adult Health

## 2015-01-30 ENCOUNTER — Other Ambulatory Visit (INDEPENDENT_AMBULATORY_CARE_PROVIDER_SITE_OTHER): Payer: Commercial Managed Care - HMO | Admitting: Adult Health

## 2015-01-30 VITALS — BP 140/80 | HR 84 | Ht 62.0 in | Wt 152.5 lb

## 2015-01-30 DIAGNOSIS — Z01419 Encounter for gynecological examination (general) (routine) without abnormal findings: Secondary | ICD-10-CM | POA: Insufficient documentation

## 2015-01-30 DIAGNOSIS — L292 Pruritus vulvae: Secondary | ICD-10-CM

## 2015-01-30 DIAGNOSIS — L290 Pruritus ani: Secondary | ICD-10-CM | POA: Insufficient documentation

## 2015-01-30 DIAGNOSIS — Z1212 Encounter for screening for malignant neoplasm of rectum: Secondary | ICD-10-CM | POA: Diagnosis not present

## 2015-01-30 DIAGNOSIS — Z124 Encounter for screening for malignant neoplasm of cervix: Secondary | ICD-10-CM | POA: Diagnosis not present

## 2015-01-30 DIAGNOSIS — Z1151 Encounter for screening for human papillomavirus (HPV): Secondary | ICD-10-CM | POA: Insufficient documentation

## 2015-01-30 HISTORY — DX: Pruritus ani: L29.0

## 2015-01-30 HISTORY — DX: Pruritus vulvae: L29.2

## 2015-01-30 LAB — HEMOCCULT GUIAC POC 1CARD (OFFICE): Fecal Occult Blood, POC: NEGATIVE

## 2015-01-30 MED ORDER — NYSTATIN-TRIAMCINOLONE 100000-0.1 UNIT/GM-% EX CREA: 1.0000 "application " | TOPICAL_CREAM | Freq: Two times a day (BID) | CUTANEOUS | Status: DC

## 2015-01-30 NOTE — Telephone Encounter (Signed)
Pt called stating that [facility] sent a prescription over to her pharmacy of a cream. The cream is not covered under her [employer] and it is costing her $80. Pt would like to know if there is something cheaper for her. Please contact pt

## 2015-01-30 NOTE — Progress Notes (Signed)
Subjective:     Patient ID: Maria Werner, female   DOB: 06-05-1950, 64 y.o.   MRN: [id number]  HPI Maria Werner is a 64 year old white female,married in for a pap smear and has some itching near clitoris.She is retired now.She had colonoscopy in March 2016 with polyps,so has to go back in 7 years.She had pneumonia shot recently. PCP is Dr Willey Blade.  Review of Systems Patient denies any headaches, hearing loss, fatigue, blurred vision, shortness of breath, chest pain, abdominal pain, problems with bowel movements, urination, or intercourse. No joint pain or mood swings.+itching near clitoris  Reviewed past medical,surgical, social and family history. Reviewed medications and allergies.     Objective:   Physical Exam BP 140/80 mmHg  Pulse 84  Ht 5\' 2"  (1.575 m)  Wt 152 lb 8 oz (69.174 kg)  BMI 27.89 kg/m2 Skin warm and dry.Pelvic: external genitalia is normal in appearance no lesions, vagina:pale with loss of moisture and rugae,urethra has no lesions or masses noted, cervix:smooth and atrophic,pap with HPV performed, uterus: normal size, shape and contour, non tender, no masses felt, adnexa: no masses or tenderness noted. Bladder is non tender and no masses felt, rectal exam has good tone, no polyps or hemorrhoids and hemoccult was negative    Assessment:    Pelvic and pap smear Vulva itch    Plan:     Rx mycolog cream use 2-3 x daily prn with 1 refill Return in 3 years for pap Mammogram yearly Labs with PCP Get flu shot in am

## 2015-01-30 NOTE — Patient Instructions (Signed)
Pap in 3 years Mammogram yearly  Labs with PCP

## 2015-01-30 NOTE — Telephone Encounter (Signed)
Check back with [facility] I called them and told to rx nystatin and rx triamcinolone cream

## 2015-02-04 ENCOUNTER — Ambulatory Visit (HOSPITAL_COMMUNITY)
Admission: RE | Admit: 2015-02-04 | Discharge: 2015-02-04 | Disposition: A | Discharge status: Home / Self Care | Payer: Commercial Managed Care - HMO | Source: Ambulatory Visit | Attending: Adult Health | Admitting: Adult Health

## 2015-02-04 DIAGNOSIS — Z1231 Encounter for screening mammogram for malignant neoplasm of breast: Secondary | ICD-10-CM | POA: Diagnosis not present

## 2015-02-04 LAB — CYTOLOGY - PAP

## 2016-02-05 ENCOUNTER — Other Ambulatory Visit: Payer: Self-pay | Admitting: Adult Health

## 2016-02-05 DIAGNOSIS — Z1231 Encounter for screening mammogram for malignant neoplasm of breast: Secondary | ICD-10-CM

## 2016-02-11 ENCOUNTER — Other Ambulatory Visit (HOSPITAL_COMMUNITY): Payer: Self-pay | Admitting: Internal Medicine

## 2016-02-11 DIAGNOSIS — Z780 Asymptomatic menopausal state: Secondary | ICD-10-CM

## 2016-02-21 ENCOUNTER — Ambulatory Visit (HOSPITAL_COMMUNITY)
Admission: RE | Admit: 2016-02-21 | Discharge: 2016-02-21 | Disposition: A | Discharge status: Home / Self Care | Payer: Commercial Managed Care - HMO | Source: Ambulatory Visit | Attending: Adult Health | Admitting: Adult Health

## 2016-02-21 ENCOUNTER — Ambulatory Visit (HOSPITAL_COMMUNITY)
Admission: RE | Admit: 2016-02-21 | Discharge: 2016-02-21 | Disposition: A | Discharge status: Home / Self Care | Payer: Commercial Managed Care - HMO | Source: Ambulatory Visit | Attending: Internal Medicine | Admitting: Internal Medicine

## 2016-02-21 DIAGNOSIS — Z780 Asymptomatic menopausal state: Secondary | ICD-10-CM | POA: Insufficient documentation

## 2016-02-21 DIAGNOSIS — Z1231 Encounter for screening mammogram for malignant neoplasm of breast: Secondary | ICD-10-CM

## 2016-02-21 DIAGNOSIS — M810 Age-related osteoporosis without current pathological fracture: Secondary | ICD-10-CM | POA: Insufficient documentation

## 2016-04-10 DIAGNOSIS — H5200 Hypermetropia, unspecified eye: Secondary | ICD-10-CM | POA: Diagnosis not present

## 2016-04-10 DIAGNOSIS — Z0100 Encounter for examination of eyes and vision without abnormal findings: Secondary | ICD-10-CM | POA: Diagnosis not present

## 2016-06-15 ENCOUNTER — Other Ambulatory Visit: Payer: Self-pay | Admitting: Adult Health

## 2016-06-23 DIAGNOSIS — B078 Other viral warts: Secondary | ICD-10-CM | POA: Diagnosis not present

## 2016-06-23 DIAGNOSIS — Z1283 Encounter for screening for malignant neoplasm of skin: Secondary | ICD-10-CM | POA: Diagnosis not present

## 2016-06-23 DIAGNOSIS — D225 Melanocytic nevi of trunk: Secondary | ICD-10-CM | POA: Diagnosis not present

## 2016-08-05 DIAGNOSIS — Z79899 Other long term (current) drug therapy: Secondary | ICD-10-CM | POA: Diagnosis not present

## 2016-08-05 DIAGNOSIS — E119 Type 2 diabetes mellitus without complications: Secondary | ICD-10-CM | POA: Diagnosis not present

## 2016-08-05 DIAGNOSIS — E785 Hyperlipidemia, unspecified: Secondary | ICD-10-CM | POA: Diagnosis not present

## 2016-08-05 DIAGNOSIS — M810 Age-related osteoporosis without current pathological fracture: Secondary | ICD-10-CM | POA: Diagnosis not present

## 2016-08-05 DIAGNOSIS — I10 Essential (primary) hypertension: Secondary | ICD-10-CM | POA: Diagnosis not present

## 2016-08-12 DIAGNOSIS — R69 Illness, unspecified: Secondary | ICD-10-CM | POA: Diagnosis not present

## 2016-08-13 DIAGNOSIS — I10 Essential (primary) hypertension: Secondary | ICD-10-CM | POA: Diagnosis not present

## 2016-08-13 DIAGNOSIS — Z6827 Body mass index (BMI) 27.0-27.9, adult: Secondary | ICD-10-CM | POA: Diagnosis not present

## 2016-08-13 DIAGNOSIS — R7303 Prediabetes: Secondary | ICD-10-CM | POA: Diagnosis not present

## 2016-08-13 DIAGNOSIS — E785 Hyperlipidemia, unspecified: Secondary | ICD-10-CM | POA: Diagnosis not present

## 2016-11-27 DIAGNOSIS — Z23 Encounter for immunization: Secondary | ICD-10-CM | POA: Diagnosis not present

## 2017-01-08 DIAGNOSIS — J209 Acute bronchitis, unspecified: Secondary | ICD-10-CM | POA: Diagnosis not present

## 2017-01-08 DIAGNOSIS — J0100 Acute maxillary sinusitis, unspecified: Secondary | ICD-10-CM | POA: Diagnosis not present

## 2017-02-15 ENCOUNTER — Other Ambulatory Visit (HOSPITAL_COMMUNITY): Payer: Self-pay | Admitting: Internal Medicine

## 2017-02-15 DIAGNOSIS — Z1231 Encounter for screening mammogram for malignant neoplasm of breast: Secondary | ICD-10-CM

## 2017-02-18 DIAGNOSIS — R7303 Prediabetes: Secondary | ICD-10-CM | POA: Diagnosis not present

## 2017-02-25 ENCOUNTER — Encounter (HOSPITAL_COMMUNITY): Payer: Self-pay

## 2017-02-25 ENCOUNTER — Ambulatory Visit (HOSPITAL_COMMUNITY)
Admission: RE | Admit: 2017-02-25 | Discharge: 2017-02-25 | Disposition: A | Discharge status: Home / Self Care | Payer: Medicare HMO | Source: Ambulatory Visit | Attending: Internal Medicine | Admitting: Internal Medicine

## 2017-02-25 DIAGNOSIS — Z1231 Encounter for screening mammogram for malignant neoplasm of breast: Secondary | ICD-10-CM | POA: Insufficient documentation

## 2017-02-25 DIAGNOSIS — I10 Essential (primary) hypertension: Secondary | ICD-10-CM | POA: Diagnosis not present

## 2017-02-25 DIAGNOSIS — R7303 Prediabetes: Secondary | ICD-10-CM | POA: Diagnosis not present

## 2017-04-15 DIAGNOSIS — R69 Illness, unspecified: Secondary | ICD-10-CM | POA: Diagnosis not present

## 2017-04-22 DIAGNOSIS — R69 Illness, unspecified: Secondary | ICD-10-CM | POA: Diagnosis not present

## 2017-07-07 DIAGNOSIS — H52222 Regular astigmatism, left eye: Secondary | ICD-10-CM | POA: Diagnosis not present

## 2017-07-12 DIAGNOSIS — Z1283 Encounter for screening for malignant neoplasm of skin: Secondary | ICD-10-CM | POA: Diagnosis not present

## 2017-07-12 DIAGNOSIS — D225 Melanocytic nevi of trunk: Secondary | ICD-10-CM | POA: Diagnosis not present

## 2017-07-12 DIAGNOSIS — L820 Inflamed seborrheic keratosis: Secondary | ICD-10-CM | POA: Diagnosis not present

## 2017-08-19 DIAGNOSIS — M1990 Unspecified osteoarthritis, unspecified site: Secondary | ICD-10-CM | POA: Diagnosis not present

## 2017-08-19 DIAGNOSIS — I10 Essential (primary) hypertension: Secondary | ICD-10-CM | POA: Diagnosis not present

## 2017-08-19 DIAGNOSIS — Z79899 Other long term (current) drug therapy: Secondary | ICD-10-CM | POA: Diagnosis not present

## 2017-08-19 DIAGNOSIS — E785 Hyperlipidemia, unspecified: Secondary | ICD-10-CM | POA: Diagnosis not present

## 2017-08-19 DIAGNOSIS — R7303 Prediabetes: Secondary | ICD-10-CM | POA: Diagnosis not present

## 2017-08-26 DIAGNOSIS — E785 Hyperlipidemia, unspecified: Secondary | ICD-10-CM | POA: Diagnosis not present

## 2017-08-26 DIAGNOSIS — Z6827 Body mass index (BMI) 27.0-27.9, adult: Secondary | ICD-10-CM | POA: Diagnosis not present

## 2017-08-26 DIAGNOSIS — I10 Essential (primary) hypertension: Secondary | ICD-10-CM | POA: Diagnosis not present

## 2017-08-26 DIAGNOSIS — R7303 Prediabetes: Secondary | ICD-10-CM | POA: Diagnosis not present

## 2017-09-20 ENCOUNTER — Ambulatory Visit (HOSPITAL_COMMUNITY)
Admission: RE | Admit: 2017-09-20 | Discharge: 2017-09-20 | Disposition: A | Discharge status: Home / Self Care | Payer: Medicare HMO | Source: Ambulatory Visit | Attending: Internal Medicine | Admitting: Internal Medicine

## 2017-09-20 ENCOUNTER — Other Ambulatory Visit (HOSPITAL_COMMUNITY): Payer: Self-pay | Admitting: Internal Medicine

## 2017-09-20 DIAGNOSIS — R059 Cough, unspecified: Secondary | ICD-10-CM

## 2017-09-20 DIAGNOSIS — R918 Other nonspecific abnormal finding of lung field: Secondary | ICD-10-CM | POA: Insufficient documentation

## 2017-09-20 DIAGNOSIS — R05 Cough: Secondary | ICD-10-CM | POA: Diagnosis not present

## 2017-11-16 ENCOUNTER — Emergency Department (HOSPITAL_COMMUNITY): Payer: Medicare HMO

## 2017-11-16 ENCOUNTER — Other Ambulatory Visit: Payer: Self-pay

## 2017-11-16 ENCOUNTER — Emergency Department (HOSPITAL_COMMUNITY)
Admission: EM | Admit: 2017-11-16 | Discharge: 2017-11-16 | Disposition: A | Discharge status: Home / Self Care | Payer: Medicare HMO | Attending: Emergency Medicine | Admitting: Emergency Medicine

## 2017-11-16 ENCOUNTER — Encounter (HOSPITAL_COMMUNITY): Payer: Self-pay | Admitting: Emergency Medicine

## 2017-11-16 DIAGNOSIS — Z79899 Other long term (current) drug therapy: Secondary | ICD-10-CM | POA: Diagnosis not present

## 2017-11-16 DIAGNOSIS — I10 Essential (primary) hypertension: Secondary | ICD-10-CM | POA: Diagnosis not present

## 2017-11-16 DIAGNOSIS — Y929 Unspecified place or not applicable: Secondary | ICD-10-CM | POA: Diagnosis not present

## 2017-11-16 DIAGNOSIS — S2221XA Fracture of manubrium, initial encounter for closed fracture: Secondary | ICD-10-CM

## 2017-11-16 DIAGNOSIS — W010XXA Fall on same level from slipping, tripping and stumbling without subsequent striking against object, initial encounter: Secondary | ICD-10-CM | POA: Insufficient documentation

## 2017-11-16 DIAGNOSIS — Z7982 Long term (current) use of aspirin: Secondary | ICD-10-CM | POA: Insufficient documentation

## 2017-11-16 DIAGNOSIS — Y93K1 Activity, walking an animal: Secondary | ICD-10-CM | POA: Insufficient documentation

## 2017-11-16 DIAGNOSIS — S20219A Contusion of unspecified front wall of thorax, initial encounter: Secondary | ICD-10-CM | POA: Insufficient documentation

## 2017-11-16 DIAGNOSIS — R51 Headache: Secondary | ICD-10-CM | POA: Diagnosis not present

## 2017-11-16 DIAGNOSIS — Y999 Unspecified external cause status: Secondary | ICD-10-CM | POA: Insufficient documentation

## 2017-11-16 DIAGNOSIS — S2220XA Unspecified fracture of sternum, initial encounter for closed fracture: Secondary | ICD-10-CM | POA: Diagnosis not present

## 2017-11-16 DIAGNOSIS — Z87891 Personal history of nicotine dependence: Secondary | ICD-10-CM | POA: Diagnosis not present

## 2017-11-16 DIAGNOSIS — W19XXXA Unspecified fall, initial encounter: Secondary | ICD-10-CM

## 2017-11-16 LAB — BASIC METABOLIC PANEL
ANION GAP: 11 (ref 5–15)
BUN: 12 mg/dL (ref 8–23)
CALCIUM: 9.8 mg/dL (ref 8.9–10.3)
CO2: 28 mmol/L (ref 22–32)
Chloride: 99 mmol/L (ref 98–111)
Creatinine, Ser: 0.73 mg/dL (ref 0.44–1.00)
GFR calc Af Amer: >60 mL/min (ref >60)
GFR calc non Af Amer: >60 mL/min (ref >60)
Glucose, Bld: 121 mg/dL — ABNORMAL HIGH (ref 70–99)
Potassium: 3.6 mmol/L (ref 3.5–5.1)
SODIUM: 138 mmol/L (ref 135–145)

## 2017-11-16 LAB — CBC
HCT: 41.9 % (ref 36.0–46.0)
Hemoglobin: 14.5 g/dL (ref 12.0–15.0)
MCH: 33.9 pg (ref 26.0–34.0)
MCHC: 34.6 g/dL (ref 30.0–36.0)
MCV: 97.9 fL (ref 78.0–100.0)
PLATELETS: 241 10*3/uL (ref 150–400)
RBC: 4.28 MIL/uL (ref 3.87–5.11)
RDW: 12.9 % (ref 11.5–15.5)
WBC: 15.6 10*3/uL — ABNORMAL HIGH (ref 4.0–10.5)

## 2017-11-16 MED ORDER — OXYCODONE-ACETAMINOPHEN 5-325 MG PO TABS: 1.0000 | ORAL_TABLET | Freq: Four times a day (QID) | ORAL | 0 refills | Status: DC | PRN

## 2017-11-16 MED ORDER — IBUPROFEN 400 MG PO TABS
400.0000 mg | ORAL_TABLET | Freq: Once | ORAL | Status: AC
  Administered 2017-11-16: 400 mg via ORAL
  Filled 2017-11-16: qty 1

## 2017-11-16 MED ORDER — DOCUSATE SODIUM 100 MG PO CAPS: 100.0000 mg | ORAL_CAPSULE | Freq: Two times a day (BID) | ORAL | 0 refills | Status: AC

## 2017-11-16 MED ORDER — MORPHINE SULFATE (PF) 4 MG/ML IV SOLN
4.0000 mg | Freq: Once | INTRAVENOUS | Status: AC
  Administered 2017-11-16: 4 mg via INTRAVENOUS
  Filled 2017-11-16: qty 1

## 2017-11-16 NOTE — Discharge Instructions (Signed)
You were seen in the ED today with a fracture of the sternum. Use your incentive spirometer as instructed while awake. Call your PCP to schedule a follow up appointment. Take the pain medication as prescribed but do not drive while taking this medication. It may also cause constipation so I have prescribed a stool softener to take as needed. Return to the ED with any shortness of breath or difficulty swallowing. Do not lift any heavy objects for at least 6 weeks.

## 2017-11-16 NOTE — ED Provider Notes (Signed)
[facility] EMERGENCY DEPARTMENT Provider Note   CSN: [id number] Arrival date & time: 11/16/17  1244     History   Chief Complaint Chief Complaint  Patient presents with  . Fall    HPI Maria Werner is a 67 y.o. female.  Patient with history of reflux, high blood pressure takes baby aspirin daily presents with right shoulder, right upper chest and right lateral neck pain and swelling since a fall.  Patient had a dog pulled her down and she landed in the right upper chest and neck area.  Patient said worsening swelling and persistent pain.  No anticoagulant use.  No head injury, lower back injury, lower extremity injuries.  Pain constant worse with palpation.     Past Medical History:  Diagnosis Date  . GERD (gastroesophageal reflux disease)   . Hyperlipidemia   . Hypertension   . Perianal itch 01/30/2015  . Post-menopausal atrophic vaginitis 09/28/2012  . Vulvar itching 01/30/2015  . Yeast infection 09/28/2012    Patient Active Problem List   Diagnosis Date Noted  . Vulvar itching 01/30/2015  . Yeast infection 09/28/2012  . Post-menopausal atrophic vaginitis 09/28/2012    Past Surgical History:  Procedure Laterality Date  . BUNIONECTOMY    . CARPAL TUNNEL RELEASE Left 02/2014  . CESAREAN SECTION    . COLONOSCOPY N/A 08/01/2014   Procedure: COLONOSCOPY;  Surgeon: Rogene Houston, MD;  Location: [facility]O SUITE;  Service: Endoscopy;  Laterality: N/A;  930  . CYSTOSTOMY W/ BLADDER BIOPSY    . DIAGNOSTIC LAPAROSCOPY       OB History    Gravida  2   Para  2   Term      Preterm      AB      Living  2     SAB      TAB      Ectopic      Multiple      Live Births  2            Home Medications    Prior to Admission medications   Medication Sig Start Date End Date Taking? Authorizing Provider  Ascorbic Acid (VITAMIN C) 1000 MG tablet Take 1,000 mg by mouth daily.   Yes [provider]  aspirin EC 81 MG tablet Take 81 mg by mouth.  Every other day   Yes [provider]  atorvastatin (LIPITOR) 20 MG tablet Take 1 tablet by mouth every evening. 11/09/17  Yes [provider]  hydrochlorothiazide (HYDRODIURIL) 25 MG tablet Take 25 mg by mouth daily.  09/17/12  Yes [provider]  KLOR-CON M20 20 MEQ tablet Take 20 mEq by mouth daily.  09/17/12  Yes [provider]  losartan (COZAAR) 50 MG tablet Take 50 mg by mouth daily.  09/01/12  Yes [provider]  nystatin cream (MYCOSTATIN) apply to affected area twice a day 06/15/16  Yes Estill Dooms, NP  nystatin-triamcinolone (MYCOLOG II) cream Apply 1 application topically 2 (two) times daily. 01/30/15  Yes Derrek Monaco A, NP  omeprazole (PRILOSEC) 20 MG capsule Take 20 mg by mouth daily.  08/30/12  Yes [provider]  docusate sodium (COLACE) 100 MG capsule Take 1 capsule (100 mg total) by mouth every 12 (twelve) hours for 7 days. 11/16/17 11/23/17  Long, Wonda Olds, MD  oxyCODONE-acetaminophen (PERCOCET/ROXICET) 5-325 MG tablet Take 1 tablet by mouth every 6 (six) hours as needed for severe pain. 11/16/17   Long, Wonda Olds, MD  Family History Family History  Problem Relation Age of Onset  . COPD Mother        emphysema  . Heart disease Father   . Cancer Father        prostate  . Diabetes Father   . Diabetes Sister   . Cancer - Colon Neg Hx     Social History Social History   Tobacco Use  . Smoking status: Former Smoker    Packs/day: 1.00    Types: Cigarettes  . Smokeless tobacco: Never Used  Substance Use Topics  . Alcohol use: No  . Drug use: No     Allergies   Patient has no known allergies.   Review of Systems Review of Systems  Constitutional: Negative for chills and fever.  HENT: Negative for congestion.   Eyes: Negative for visual disturbance.  Respiratory: Negative for shortness of breath.   Cardiovascular: Positive for chest pain.  Gastrointestinal: Negative for abdominal pain and vomiting.    Genitourinary: Negative for dysuria and flank pain.  Musculoskeletal: Positive for neck pain. Negative for back pain and neck stiffness.  Skin: Negative for rash.  Neurological: Negative for light-headedness and headaches.     Physical Exam Updated Vital Signs BP 130/90 (BP Location: Left Arm)   Pulse 83   Temp 99 F (37.2 C) (Oral)   Resp 17   Ht 5\' 1"  (1.549 m)   Wt 65.8 kg   SpO2 95%   BMI 27.40 kg/m   Physical Exam  Constitutional: She is oriented to person, place, and time. She appears well-developed and well-nourished.  HENT:  Head: Normocephalic and atraumatic.  Eyes: Conjunctivae are normal. Right eye exhibits no discharge. Left eye exhibits no discharge.  Neck: Normal range of motion. Neck supple. No tracheal deviation present.  Cardiovascular: Normal rate and regular rhythm.  Pulmonary/Chest: Effort normal and breath sounds normal.  Abdominal: Soft. She exhibits no distension. There is no tenderness. There is no guarding.  Musculoskeletal: She exhibits edema and tenderness.  Patient has tenderness to right upper chest wall ribs and sternum region with mild edema extending to the right anterior lateral cervical region.  No stridor.  Pain to palpation.  No midline cervical tenderness full range of motion head neck.  No significant tenderness to the right or left upper extremities.  Superficial abrasion to the right AC area.  No significant clavicular tenderness.  Neurological: She is alert and oriented to person, place, and time.  Skin: Skin is warm. No rash noted.  Psychiatric: She has a normal mood and affect.  Nursing note and vitals reviewed.    ED Treatments / Results  Labs (all labs ordered are listed, but only abnormal results are displayed) Labs Reviewed  CBC - Abnormal; Notable for the following components:      Result Value   WBC 15.6 (*)    All other components within normal limits  BASIC METABOLIC PANEL - Abnormal; Notable for the following  components:   Glucose, Bld 121 (*)    All other components within normal limits    EKG None  Radiology No results found.  Procedures Procedures (including critical care time)  Medications Ordered in ED Medications  ibuprofen (ADVIL,MOTRIN) tablet 400 mg (400 mg Oral Given 11/16/17 1359)  morphine 4 MG/ML injection 4 mg (4 mg Intravenous Given 11/16/17 1622)     Initial Impression / Assessment and Plan / ED Course  I have reviewed the triage vital signs and the nursing notes.  Pertinent labs &  imaging results that were available during my care of the patient were reviewed by me and considered in my medical decision making (see chart for details).    Patient presents with lower risk fall after being pulled down by a dog.  Patient has swelling and tenderness to the chest wall, CT scan without contrast revealed hematoma, manubrium fracture and muscle inflammation.  Discussed with radiologist.  Paged ENT for further recommendations for either observation in the hospital versus close follow-up in the clinic. ENT recommended trauma consult since below neck. Trauma consulted, pt signed out to ED attending to fup trauma results and reassess. On my reassessment prior to shift change patient did feel improved, swelling she felt was better and had mild persistent pain    Final Clinical Impressions(s) / ED Diagnoses   Final diagnoses:  Fracture of manubrium, initial encounter for closed fracture  Fall, initial encounter    ED Discharge Orders         Ordered    oxyCODONE-acetaminophen (PERCOCET/ROXICET) 5-325 MG tablet  Every 6 hours PRN     11/16/17 1800    docusate sodium (COLACE) 100 MG capsule  Every 12 hours     11/16/17 1800           Elnora Morrison, MD 11/22/17 1819

## 2017-11-16 NOTE — ED Triage Notes (Signed)
Patient complains of right shoulder pain after being "jerked down" while walking a dog. States swelling to right shoulder, neck and clavicle. Pt also states it now hurts to swallow. NAD.

## 2017-11-16 NOTE — ED Provider Notes (Signed)
Blood pressure (!) 147/70, pulse (!) 104, temperature 99 F (37.2 C), temperature source Oral, resp. rate 18, height 5\' 1"  (1.549 m), weight 65.8 kg, SpO2 98 %.  Assuming care from Dr. Reather Converse.  In short, Maria Werner is a 67 y.o. female with a chief complaint of Fall .  Refer to the original H&P for additional details.  The current plan of care is to f/u on trauma recommendations and pain control.  05:45 PM Spoke with trauma surgery, Dr. Rosendo Gros. Advises home with incentive spirometer, pain control, and PCP follow up. No indication for obs admit. Patient symptoms continue to improve with pain meds here and ice pack. Discussed plan with patient and Maria Werner. They are comfortable with the plan at discharge.   Nanda Quinton, MD    Margette Fast, MD 11/16/17 [id number]

## 2017-11-24 DIAGNOSIS — S2220XA Unspecified fracture of sternum, initial encounter for closed fracture: Secondary | ICD-10-CM | POA: Diagnosis not present

## 2018-01-21 ENCOUNTER — Other Ambulatory Visit (HOSPITAL_COMMUNITY): Payer: Self-pay | Admitting: Internal Medicine

## 2018-01-21 DIAGNOSIS — Z1231 Encounter for screening mammogram for malignant neoplasm of breast: Secondary | ICD-10-CM

## 2018-01-24 DIAGNOSIS — Z23 Encounter for immunization: Secondary | ICD-10-CM | POA: Diagnosis not present

## 2018-02-02 ENCOUNTER — Encounter: Payer: Self-pay | Admitting: Adult Health

## 2018-02-02 ENCOUNTER — Ambulatory Visit: Payer: Medicare HMO | Admitting: Adult Health

## 2018-02-02 ENCOUNTER — Encounter (INDEPENDENT_AMBULATORY_CARE_PROVIDER_SITE_OTHER): Payer: Self-pay

## 2018-02-02 ENCOUNTER — Other Ambulatory Visit (HOSPITAL_COMMUNITY)
Admission: RE | Admit: 2018-02-02 | Discharge: 2018-02-02 | Disposition: A | Discharge status: Home / Self Care | Payer: Medicare HMO | Source: Ambulatory Visit | Attending: Adult Health | Admitting: Adult Health

## 2018-02-02 VITALS — BP 132/81 | HR 76 | Ht 61.0 in | Wt 150.0 lb

## 2018-02-02 DIAGNOSIS — Z1212 Encounter for screening for malignant neoplasm of rectum: Secondary | ICD-10-CM

## 2018-02-02 DIAGNOSIS — Z01419 Encounter for gynecological examination (general) (routine) without abnormal findings: Secondary | ICD-10-CM | POA: Diagnosis not present

## 2018-02-02 DIAGNOSIS — Z1211 Encounter for screening for malignant neoplasm of colon: Secondary | ICD-10-CM | POA: Diagnosis not present

## 2018-02-02 DIAGNOSIS — L292 Pruritus vulvae: Secondary | ICD-10-CM

## 2018-02-02 LAB — HEMOCCULT GUIAC POC 1CARD (OFFICE): FECAL OCCULT BLD: NEGATIVE

## 2018-02-02 NOTE — Progress Notes (Signed)
Patient ID: Maria Werner, female   DOB: 08-Apr-1950, 67 y.o.   MRN: [id number] History of Present Illness: Maria Werner is a 67 year old white female, married, PM in for a well woman gyn exam and pap.She is retired and helps with grand kids when needed.  PCP is Dr Willey Blade.   Current Medications, Allergies, Past Medical History, Past Surgical History, Family History and Social History were reviewed in [employer] record.     Review of Systems: Patient denies any headaches, hearing loss, fatigue, blurred vision, shortness of breath, chest pain, abdominal pain, problems with bowel movements, urination)has SUI at times), or intercourse. No joint pain or mood swings. Has vulva itching a times,and mytrex relieves it, and she has refills, she says.     Physical Exam:BP 132/81 (BP Location: Left Arm, Patient Position: Sitting, Cuff Size: Normal)   Pulse 76   Ht 5\' 1"  (1.549 m)   Wt 150 lb (68 kg)   BMI 28.34 kg/m  General:  Well developed, well nourished, no acute distress Skin:  Warm and dry Neck:  Midline trachea, normal thyroid, good ROM, no lymphadenopathy,no carotid bruits heard, has swelling between clavicles where fell walking the dog  Lungs; Clear to auscultation bilaterally Breast:  No dominant palpable mass, retraction, or nipple discharge Cardiovascular: Regular rate and rhythm Abdomen:  Soft, non tender, no hepatosplenomegaly Pelvic:  External genitalia is normal in appearance, no lesions.  The vagina is pale with loss of color, moisture and rugae. Urethra has no lesions or masses. The cervix is smooth, pap with HPV performed.  Uterus is felt to be normal size, shape, and contour.  No adnexal masses or tenderness noted.Bladder is non tender, no masses felt. Rectal: Good sphincter tone, no polyps, or hemorrhoids felt.  Hemoccult negative. Extremities/musculoskeletal:  No swelling or varicosities noted, no clubbing or cyanosis Psych:  No mood changes, alert and  cooperative,seems happy PHQ 2 score 0. Pt gave verbal consent for exam without chaperone.   Impression: 1. Encounter for gynecological examination with Papanicolaou smear of cervix   2. Screening for colorectal cancer   3. Vulvar itching       Plan: Pap and physical in 3 years Mammogram yearly Labs with PCP Colonoscopy per Dr Laural Golden

## 2018-02-04 LAB — CYTOLOGY - PAP: HPV: NOT DETECTED

## 2018-02-24 DIAGNOSIS — E119 Type 2 diabetes mellitus without complications: Secondary | ICD-10-CM | POA: Diagnosis not present

## 2018-02-28 ENCOUNTER — Encounter (HOSPITAL_COMMUNITY): Payer: Self-pay

## 2018-02-28 ENCOUNTER — Ambulatory Visit (HOSPITAL_COMMUNITY)
Admission: RE | Admit: 2018-02-28 | Discharge: 2018-02-28 | Disposition: A | Discharge status: Home / Self Care | Payer: Medicare HMO | Source: Ambulatory Visit | Attending: Internal Medicine | Admitting: Internal Medicine

## 2018-02-28 DIAGNOSIS — Z1231 Encounter for screening mammogram for malignant neoplasm of breast: Secondary | ICD-10-CM | POA: Insufficient documentation

## 2018-03-04 DIAGNOSIS — I10 Essential (primary) hypertension: Secondary | ICD-10-CM | POA: Diagnosis not present

## 2018-03-04 DIAGNOSIS — R7303 Prediabetes: Secondary | ICD-10-CM | POA: Diagnosis not present

## 2018-04-27 DIAGNOSIS — R69 Illness, unspecified: Secondary | ICD-10-CM | POA: Diagnosis not present

## 2018-04-28 ENCOUNTER — Other Ambulatory Visit (HOSPITAL_COMMUNITY): Payer: Self-pay | Admitting: Internal Medicine

## 2018-04-28 DIAGNOSIS — M8580 Other specified disorders of bone density and structure, unspecified site: Secondary | ICD-10-CM

## 2018-04-28 DIAGNOSIS — Z780 Asymptomatic menopausal state: Secondary | ICD-10-CM

## 2018-05-16 ENCOUNTER — Ambulatory Visit (HOSPITAL_COMMUNITY)
Admission: RE | Admit: 2018-05-16 | Discharge: 2018-05-16 | Disposition: A | Discharge status: Home / Self Care | Payer: Medicare HMO | Source: Ambulatory Visit | Attending: Internal Medicine | Admitting: Internal Medicine

## 2018-05-16 DIAGNOSIS — Z780 Asymptomatic menopausal state: Secondary | ICD-10-CM | POA: Insufficient documentation

## 2018-05-16 DIAGNOSIS — M8580 Other specified disorders of bone density and structure, unspecified site: Secondary | ICD-10-CM | POA: Insufficient documentation

## 2018-05-16 DIAGNOSIS — M810 Age-related osteoporosis without current pathological fracture: Secondary | ICD-10-CM | POA: Diagnosis not present

## 2018-07-12 DIAGNOSIS — D225 Melanocytic nevi of trunk: Secondary | ICD-10-CM | POA: Diagnosis not present

## 2018-07-12 DIAGNOSIS — Z1283 Encounter for screening for malignant neoplasm of skin: Secondary | ICD-10-CM | POA: Diagnosis not present

## 2018-07-12 DIAGNOSIS — B078 Other viral warts: Secondary | ICD-10-CM | POA: Diagnosis not present

## 2018-07-21 ENCOUNTER — Other Ambulatory Visit: Payer: Self-pay | Admitting: Adult Health

## 2018-08-23 DIAGNOSIS — Z0100 Encounter for examination of eyes and vision without abnormal findings: Secondary | ICD-10-CM | POA: Diagnosis not present

## 2018-08-23 DIAGNOSIS — H5200 Hypermetropia, unspecified eye: Secondary | ICD-10-CM | POA: Diagnosis not present

## 2018-08-30 DIAGNOSIS — R7303 Prediabetes: Secondary | ICD-10-CM | POA: Diagnosis not present

## 2018-08-30 DIAGNOSIS — M8000XS Age-related osteoporosis with current pathological fracture, unspecified site, sequela: Secondary | ICD-10-CM | POA: Diagnosis not present

## 2018-08-30 DIAGNOSIS — I10 Essential (primary) hypertension: Secondary | ICD-10-CM | POA: Diagnosis not present

## 2018-08-30 DIAGNOSIS — E785 Hyperlipidemia, unspecified: Secondary | ICD-10-CM | POA: Diagnosis not present

## 2018-08-31 DIAGNOSIS — B078 Other viral warts: Secondary | ICD-10-CM | POA: Diagnosis not present

## 2018-08-31 DIAGNOSIS — B079 Viral wart, unspecified: Secondary | ICD-10-CM | POA: Diagnosis not present

## 2018-09-06 DIAGNOSIS — I10 Essential (primary) hypertension: Secondary | ICD-10-CM | POA: Diagnosis not present

## 2018-09-06 DIAGNOSIS — E785 Hyperlipidemia, unspecified: Secondary | ICD-10-CM | POA: Diagnosis not present

## 2018-09-06 DIAGNOSIS — R7303 Prediabetes: Secondary | ICD-10-CM | POA: Diagnosis not present

## 2018-11-02 DIAGNOSIS — R69 Illness, unspecified: Secondary | ICD-10-CM | POA: Diagnosis not present

## 2018-12-09 DIAGNOSIS — Z23 Encounter for immunization: Secondary | ICD-10-CM | POA: Diagnosis not present

## 2018-12-15 DIAGNOSIS — S83282A Other tear of lateral meniscus, current injury, left knee, initial encounter: Secondary | ICD-10-CM | POA: Diagnosis not present

## 2018-12-15 DIAGNOSIS — M1712 Unilateral primary osteoarthritis, left knee: Secondary | ICD-10-CM | POA: Diagnosis not present

## 2019-01-13 DIAGNOSIS — R69 Illness, unspecified: Secondary | ICD-10-CM | POA: Diagnosis not present

## 2019-01-20 ENCOUNTER — Other Ambulatory Visit (HOSPITAL_COMMUNITY): Payer: Self-pay | Admitting: Internal Medicine

## 2019-01-20 DIAGNOSIS — Z1231 Encounter for screening mammogram for malignant neoplasm of breast: Secondary | ICD-10-CM

## 2019-03-06 ENCOUNTER — Ambulatory Visit (HOSPITAL_COMMUNITY): Payer: Medicare HMO

## 2019-03-07 DIAGNOSIS — R7303 Prediabetes: Secondary | ICD-10-CM | POA: Diagnosis not present

## 2019-03-08 ENCOUNTER — Ambulatory Visit (HOSPITAL_COMMUNITY)
Admission: RE | Admit: 2019-03-08 | Discharge: 2019-03-08 | Disposition: A | Discharge status: Home / Self Care | Payer: Medicare HMO | Source: Ambulatory Visit | Attending: Internal Medicine | Admitting: Internal Medicine

## 2019-03-08 ENCOUNTER — Other Ambulatory Visit: Payer: Self-pay

## 2019-03-08 DIAGNOSIS — Z1231 Encounter for screening mammogram for malignant neoplasm of breast: Secondary | ICD-10-CM | POA: Diagnosis not present

## 2019-03-14 DIAGNOSIS — R7303 Prediabetes: Secondary | ICD-10-CM | POA: Diagnosis not present

## 2019-03-14 DIAGNOSIS — I10 Essential (primary) hypertension: Secondary | ICD-10-CM | POA: Diagnosis not present

## 2019-04-10 ENCOUNTER — Telehealth: Payer: Self-pay | Admitting: Adult Health

## 2019-04-10 NOTE — Telephone Encounter (Signed)
Has occasion l itching of vulva and mycolog helps

## 2019-04-10 NOTE — Telephone Encounter (Signed)
Pt would like for Anderson Malta to give her call please/ using 2 kinds of cream for itching and wanted to talk about another cream someone talked to her about (Estradiol) Please call

## 2019-05-11 DIAGNOSIS — R69 Illness, unspecified: Secondary | ICD-10-CM | POA: Diagnosis not present

## 2019-09-05 DIAGNOSIS — Z79899 Other long term (current) drug therapy: Secondary | ICD-10-CM | POA: Diagnosis not present

## 2019-09-05 DIAGNOSIS — M810 Age-related osteoporosis without current pathological fracture: Secondary | ICD-10-CM | POA: Diagnosis not present

## 2019-09-05 DIAGNOSIS — I10 Essential (primary) hypertension: Secondary | ICD-10-CM | POA: Diagnosis not present

## 2019-09-05 DIAGNOSIS — E785 Hyperlipidemia, unspecified: Secondary | ICD-10-CM | POA: Diagnosis not present

## 2019-09-05 DIAGNOSIS — R7303 Prediabetes: Secondary | ICD-10-CM | POA: Diagnosis not present

## 2019-09-12 DIAGNOSIS — I10 Essential (primary) hypertension: Secondary | ICD-10-CM | POA: Diagnosis not present

## 2019-09-12 DIAGNOSIS — E785 Hyperlipidemia, unspecified: Secondary | ICD-10-CM | POA: Diagnosis not present

## 2019-09-12 DIAGNOSIS — Z6828 Body mass index (BMI) 28.0-28.9, adult: Secondary | ICD-10-CM | POA: Diagnosis not present

## 2019-09-12 DIAGNOSIS — R7303 Prediabetes: Secondary | ICD-10-CM | POA: Diagnosis not present

## 2019-09-13 DIAGNOSIS — M2011 Hallux valgus (acquired), right foot: Secondary | ICD-10-CM | POA: Diagnosis not present

## 2019-09-26 DIAGNOSIS — L570 Actinic keratosis: Secondary | ICD-10-CM | POA: Diagnosis not present

## 2019-09-26 DIAGNOSIS — D225 Melanocytic nevi of trunk: Secondary | ICD-10-CM | POA: Diagnosis not present

## 2019-09-26 DIAGNOSIS — Z1283 Encounter for screening for malignant neoplasm of skin: Secondary | ICD-10-CM | POA: Diagnosis not present

## 2019-09-26 DIAGNOSIS — X32XXXA Exposure to sunlight, initial encounter: Secondary | ICD-10-CM | POA: Diagnosis not present

## 2020-01-01 DIAGNOSIS — Z20822 Contact with and (suspected) exposure to covid-19: Secondary | ICD-10-CM | POA: Diagnosis not present

## 2020-01-05 ENCOUNTER — Other Ambulatory Visit: Payer: Self-pay | Admitting: Adult Health

## 2020-01-09 DIAGNOSIS — R051 Acute cough: Secondary | ICD-10-CM | POA: Diagnosis not present

## 2020-01-10 ENCOUNTER — Other Ambulatory Visit: Payer: Self-pay

## 2020-01-10 ENCOUNTER — Ambulatory Visit (HOSPITAL_COMMUNITY)
Admission: RE | Admit: 2020-01-10 | Discharge: 2020-01-10 | Disposition: A | Discharge status: Home / Self Care | Payer: Medicare HMO | Source: Ambulatory Visit | Attending: Internal Medicine | Admitting: Internal Medicine

## 2020-01-10 ENCOUNTER — Other Ambulatory Visit (HOSPITAL_COMMUNITY): Payer: Self-pay | Admitting: Internal Medicine

## 2020-01-10 DIAGNOSIS — R059 Cough, unspecified: Secondary | ICD-10-CM

## 2020-02-05 DIAGNOSIS — R051 Acute cough: Secondary | ICD-10-CM | POA: Diagnosis not present

## 2020-02-05 DIAGNOSIS — I10 Essential (primary) hypertension: Secondary | ICD-10-CM | POA: Diagnosis not present

## 2020-02-06 ENCOUNTER — Other Ambulatory Visit (HOSPITAL_COMMUNITY): Payer: Self-pay | Admitting: Internal Medicine

## 2020-02-06 DIAGNOSIS — Z1231 Encounter for screening mammogram for malignant neoplasm of breast: Secondary | ICD-10-CM

## 2020-02-15 DIAGNOSIS — H5200 Hypermetropia, unspecified eye: Secondary | ICD-10-CM | POA: Diagnosis not present

## 2020-03-07 DIAGNOSIS — R7301 Impaired fasting glucose: Secondary | ICD-10-CM | POA: Diagnosis not present

## 2020-03-14 DIAGNOSIS — I10 Essential (primary) hypertension: Secondary | ICD-10-CM | POA: Diagnosis not present

## 2020-03-14 DIAGNOSIS — R7309 Other abnormal glucose: Secondary | ICD-10-CM | POA: Diagnosis not present

## 2020-03-22 ENCOUNTER — Ambulatory Visit (HOSPITAL_COMMUNITY)
Admission: RE | Admit: 2020-03-22 | Discharge: 2020-03-22 | Disposition: A | Discharge status: Home / Self Care | Payer: Medicare HMO | Source: Ambulatory Visit | Attending: Internal Medicine | Admitting: Internal Medicine

## 2020-03-22 ENCOUNTER — Other Ambulatory Visit: Payer: Self-pay

## 2020-03-22 DIAGNOSIS — Z1231 Encounter for screening mammogram for malignant neoplasm of breast: Secondary | ICD-10-CM | POA: Insufficient documentation

## 2020-08-20 IMAGING — MG DIGITAL SCREENING BILAT W/ TOMO W/ CAD
8 series · 8 of 24 positions shown · non-contrast
Comparison: Previous exam(s).

CLINICAL DATA: Screening.

EXAM:
DIGITAL SCREENING BILATERAL MAMMOGRAM WITH TOMO AND CAD

[L CC synth-2D]
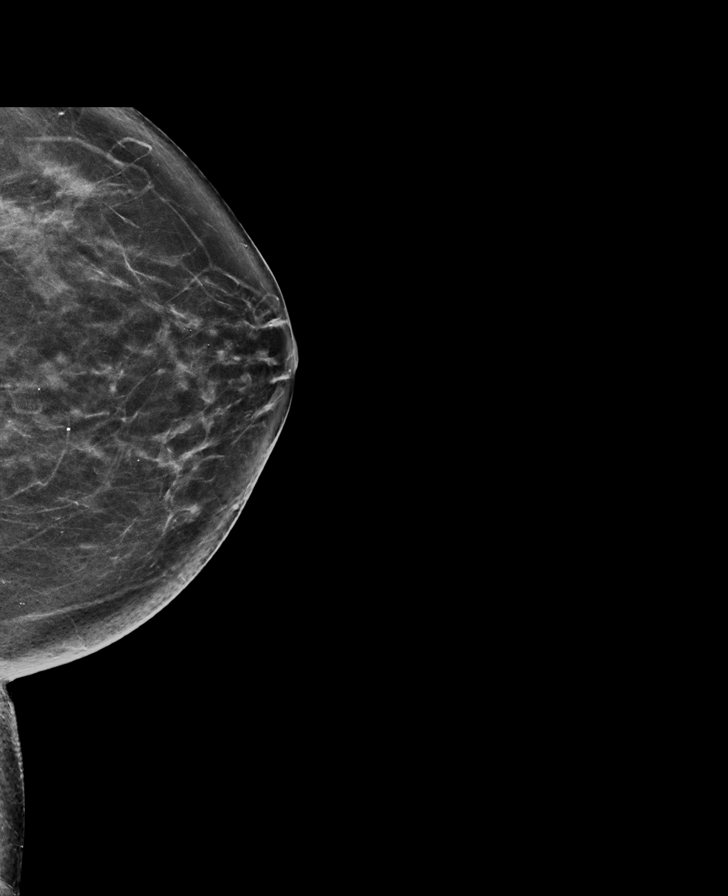

[R CC synth-2D]
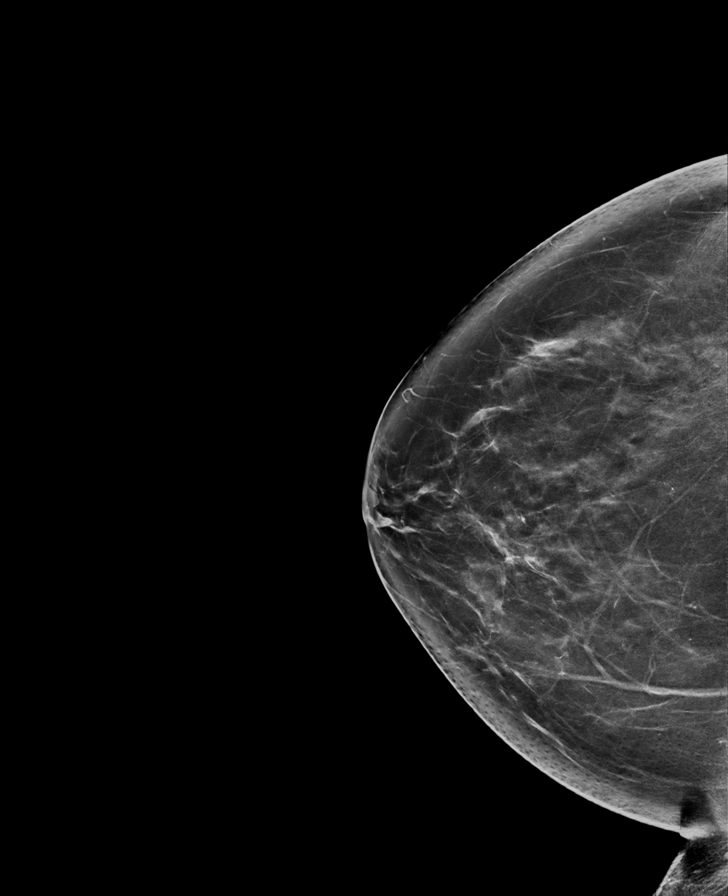

[L MLO synth-2D]
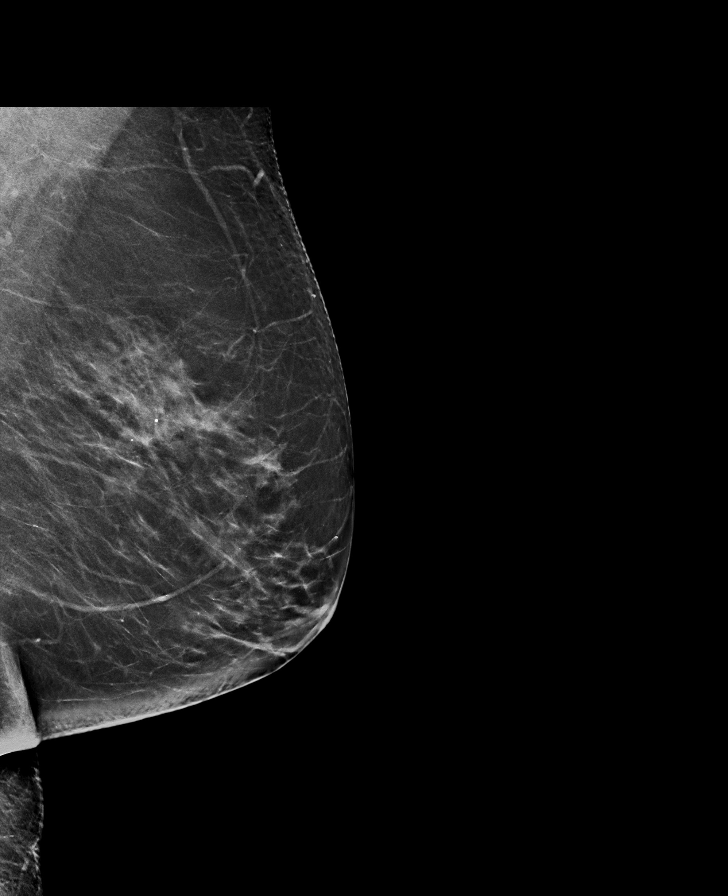

[R MLO synth-2D]
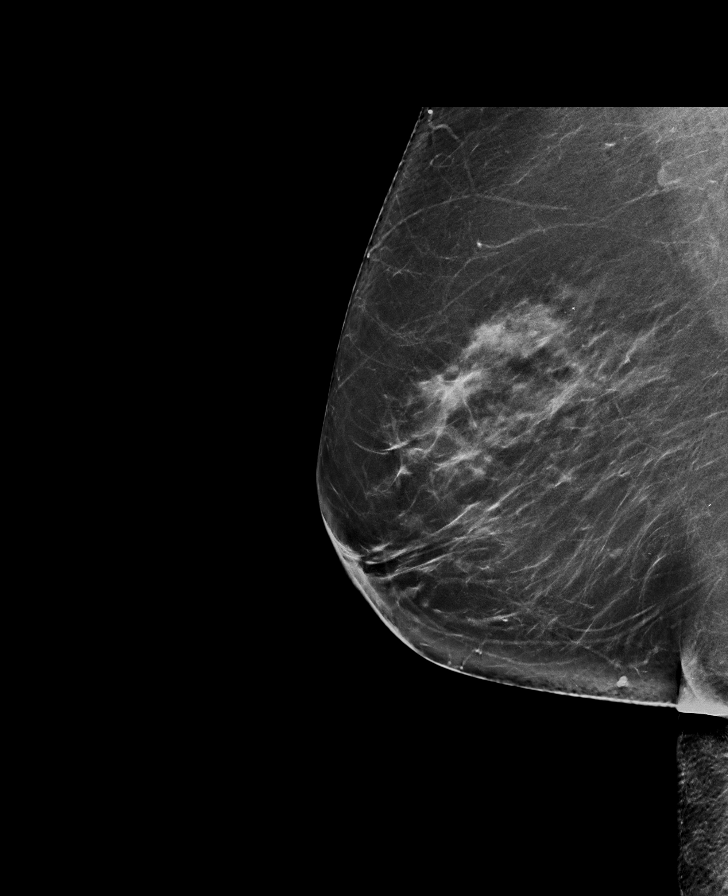

[L MLO tomo · tomo slice 38/75.0]
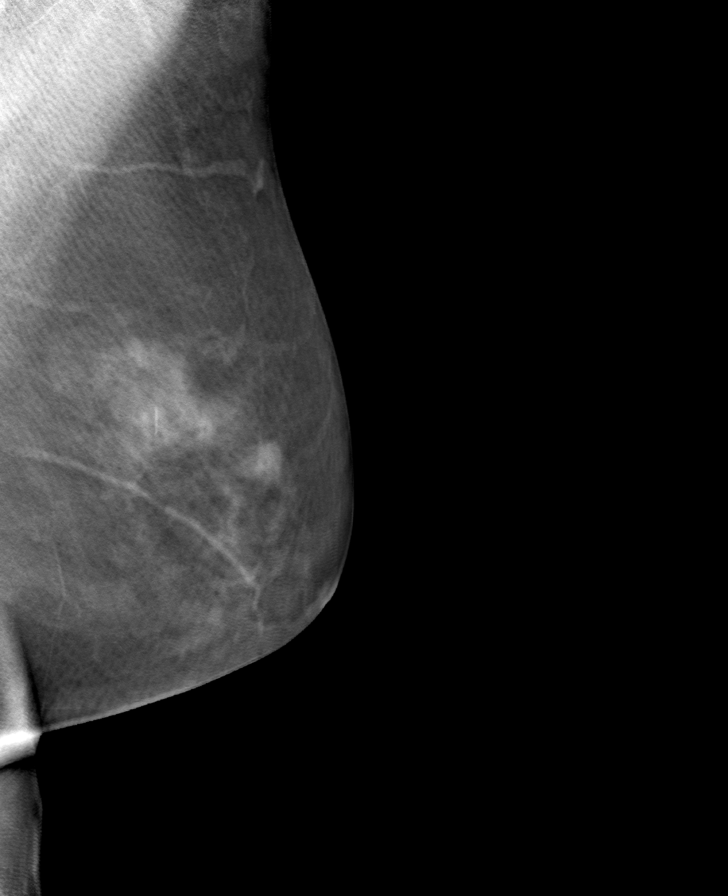

[R MLO tomo · tomo slice 40/79.0]
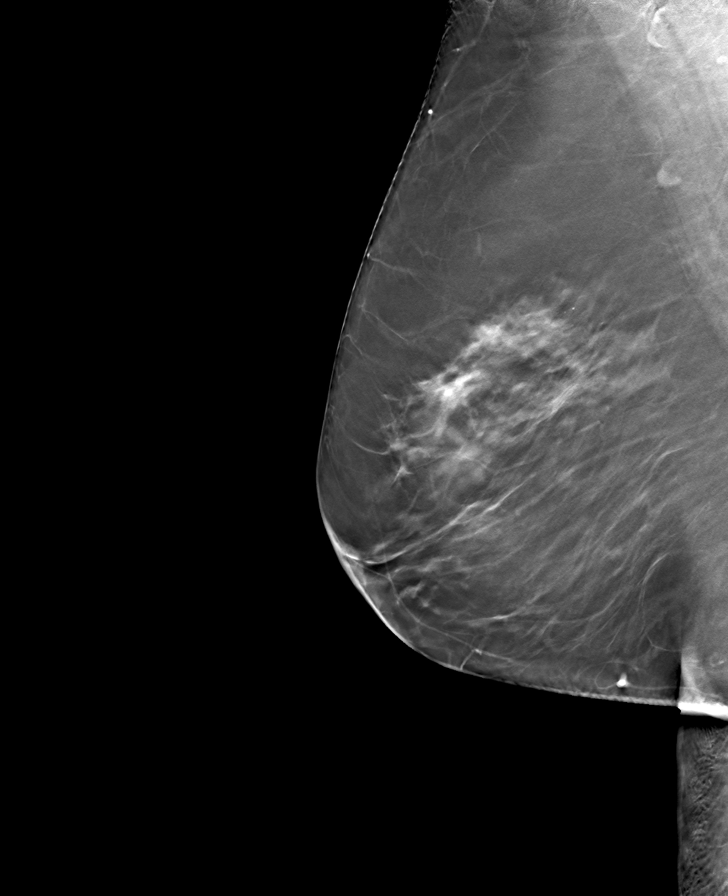

[L CC tomo · tomo slice 37/72.0]
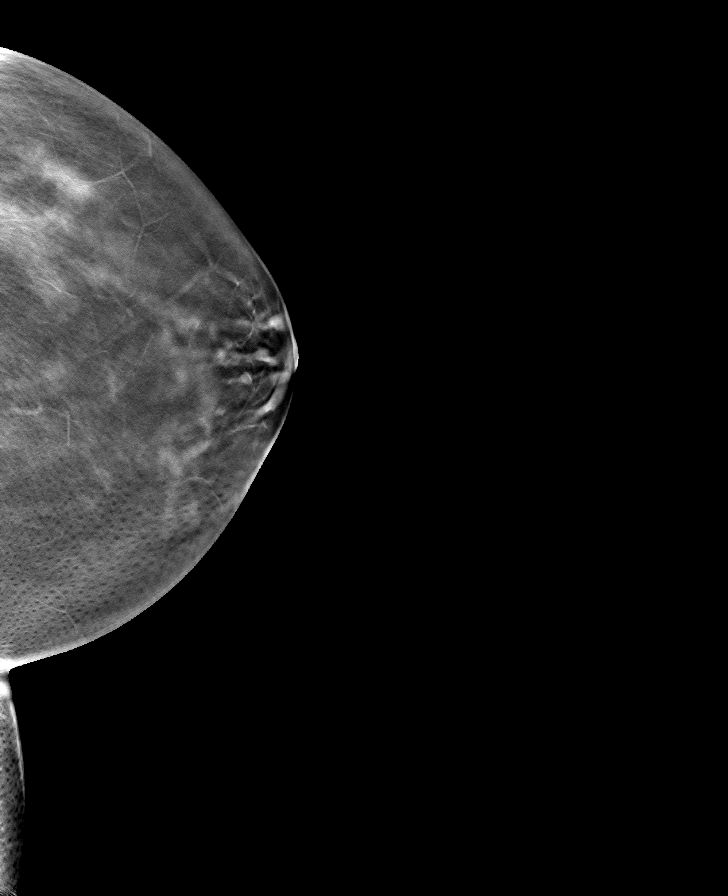

[R CC tomo · tomo slice 41/80.0]
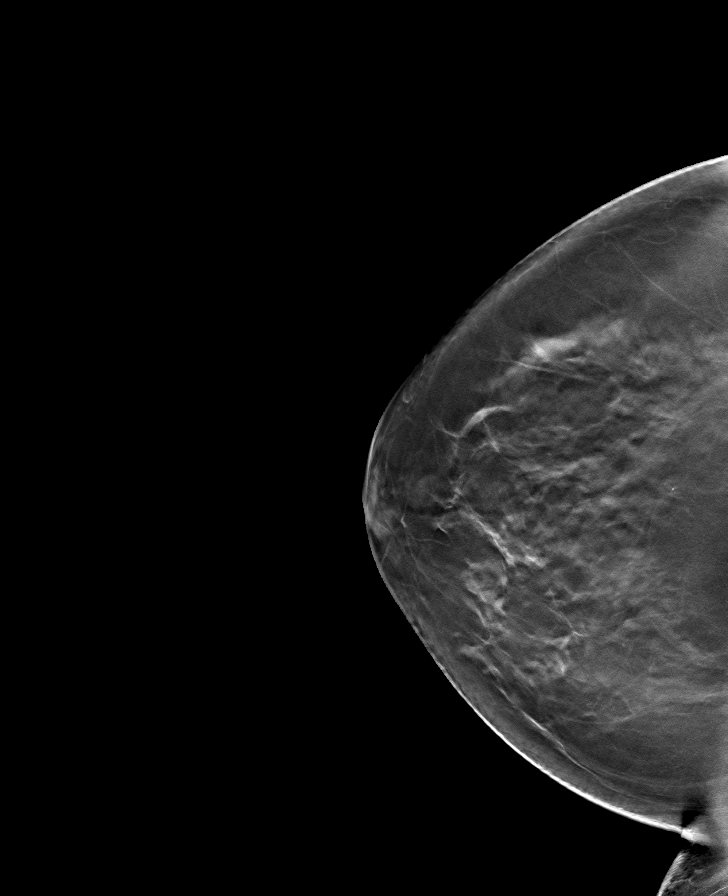

[8 of 24 positions shown; findings below may reference images not displayed]

ACR Breast Density Category b: There are scattered areas of
fibroglandular density.
FINDINGS: There are no findings suspicious for malignancy. Images were
processed with CAD.
IMPRESSION: No mammographic evidence of malignancy. A result letter of this
screening mammogram will be mailed directly to the patient.

RECOMMENDATION:
Screening mammogram in one year. (Code:CN-U-775)

BI-RADS CATEGORY  1: Negative.

## 2020-09-06 DIAGNOSIS — I10 Essential (primary) hypertension: Secondary | ICD-10-CM | POA: Diagnosis not present

## 2020-09-06 DIAGNOSIS — R7301 Impaired fasting glucose: Secondary | ICD-10-CM | POA: Diagnosis not present

## 2020-09-06 DIAGNOSIS — E785 Hyperlipidemia, unspecified: Secondary | ICD-10-CM | POA: Diagnosis not present

## 2020-09-06 DIAGNOSIS — M810 Age-related osteoporosis without current pathological fracture: Secondary | ICD-10-CM | POA: Diagnosis not present

## 2020-10-01 DIAGNOSIS — Z1283 Encounter for screening for malignant neoplasm of skin: Secondary | ICD-10-CM | POA: Diagnosis not present

## 2020-10-01 DIAGNOSIS — D225 Melanocytic nevi of trunk: Secondary | ICD-10-CM | POA: Diagnosis not present

## 2020-10-03 DIAGNOSIS — R7309 Other abnormal glucose: Secondary | ICD-10-CM | POA: Diagnosis not present

## 2020-10-03 DIAGNOSIS — R809 Proteinuria, unspecified: Secondary | ICD-10-CM | POA: Diagnosis not present

## 2020-10-03 DIAGNOSIS — E785 Hyperlipidemia, unspecified: Secondary | ICD-10-CM | POA: Diagnosis not present

## 2020-10-03 DIAGNOSIS — I10 Essential (primary) hypertension: Secondary | ICD-10-CM | POA: Diagnosis not present

## 2020-10-03 DIAGNOSIS — E0800 Diabetes mellitus due to underlying condition with hyperosmolarity without nonketotic hyperglycemic-hyperosmolar coma (NKHHC): Secondary | ICD-10-CM | POA: Diagnosis not present

## 2020-11-01 ENCOUNTER — Other Ambulatory Visit: Payer: Self-pay | Admitting: Adult Health

## 2020-11-13 DIAGNOSIS — I10 Essential (primary) hypertension: Secondary | ICD-10-CM | POA: Diagnosis not present

## 2020-11-13 DIAGNOSIS — E785 Hyperlipidemia, unspecified: Secondary | ICD-10-CM | POA: Diagnosis not present

## 2021-01-21 DIAGNOSIS — Z23 Encounter for immunization: Secondary | ICD-10-CM | POA: Diagnosis not present

## 2021-01-28 DIAGNOSIS — E1129 Type 2 diabetes mellitus with other diabetic kidney complication: Secondary | ICD-10-CM | POA: Diagnosis not present

## 2021-02-03 DIAGNOSIS — R7309 Other abnormal glucose: Secondary | ICD-10-CM | POA: Diagnosis not present

## 2021-02-03 DIAGNOSIS — I10 Essential (primary) hypertension: Secondary | ICD-10-CM | POA: Diagnosis not present

## 2021-02-03 DIAGNOSIS — E1122 Type 2 diabetes mellitus with diabetic chronic kidney disease: Secondary | ICD-10-CM | POA: Diagnosis not present

## 2021-02-12 DIAGNOSIS — E039 Hypothyroidism, unspecified: Secondary | ICD-10-CM | POA: Diagnosis not present

## 2021-02-12 DIAGNOSIS — I10 Essential (primary) hypertension: Secondary | ICD-10-CM | POA: Diagnosis not present

## 2021-02-12 DIAGNOSIS — M810 Age-related osteoporosis without current pathological fracture: Secondary | ICD-10-CM | POA: Diagnosis not present

## 2021-02-19 ENCOUNTER — Other Ambulatory Visit (HOSPITAL_COMMUNITY): Payer: Self-pay | Admitting: Internal Medicine

## 2021-02-19 DIAGNOSIS — Z1231 Encounter for screening mammogram for malignant neoplasm of breast: Secondary | ICD-10-CM

## 2021-03-24 ENCOUNTER — Other Ambulatory Visit: Payer: Self-pay

## 2021-03-24 ENCOUNTER — Ambulatory Visit (HOSPITAL_COMMUNITY)
Admission: RE | Admit: 2021-03-24 | Discharge: 2021-03-24 | Disposition: A | Discharge status: Home / Self Care | Payer: Medicare HMO | Source: Ambulatory Visit | Attending: Internal Medicine | Admitting: Internal Medicine

## 2021-03-24 DIAGNOSIS — Z1231 Encounter for screening mammogram for malignant neoplasm of breast: Secondary | ICD-10-CM | POA: Diagnosis not present

## 2021-03-25 ENCOUNTER — Other Ambulatory Visit (HOSPITAL_COMMUNITY): Payer: Self-pay | Admitting: Internal Medicine

## 2021-03-25 DIAGNOSIS — R928 Other abnormal and inconclusive findings on diagnostic imaging of breast: Secondary | ICD-10-CM

## 2021-04-01 ENCOUNTER — Other Ambulatory Visit: Payer: Self-pay

## 2021-04-01 ENCOUNTER — Ambulatory Visit (HOSPITAL_COMMUNITY)
Admission: RE | Admit: 2021-04-01 | Discharge: 2021-04-01 | Disposition: A | Discharge status: Home / Self Care | Payer: Medicare HMO | Source: Ambulatory Visit | Attending: Internal Medicine | Admitting: Internal Medicine

## 2021-04-01 DIAGNOSIS — R928 Other abnormal and inconclusive findings on diagnostic imaging of breast: Secondary | ICD-10-CM

## 2021-04-01 DIAGNOSIS — R922 Inconclusive mammogram: Secondary | ICD-10-CM | POA: Diagnosis not present

## 2021-04-08 DIAGNOSIS — H25813 Combined forms of age-related cataract, bilateral: Secondary | ICD-10-CM | POA: Diagnosis not present

## 2021-04-24 ENCOUNTER — Ambulatory Visit (INDEPENDENT_AMBULATORY_CARE_PROVIDER_SITE_OTHER): Payer: Medicare HMO | Admitting: Adult Health

## 2021-04-24 ENCOUNTER — Encounter: Payer: Self-pay | Admitting: Adult Health

## 2021-04-24 ENCOUNTER — Other Ambulatory Visit: Payer: Self-pay

## 2021-04-24 VITALS — BP 139/83 | HR 78 | Ht 61.0 in | Wt 152.0 lb

## 2021-04-24 DIAGNOSIS — Z01419 Encounter for gynecological examination (general) (routine) without abnormal findings: Secondary | ICD-10-CM | POA: Diagnosis not present

## 2021-04-24 DIAGNOSIS — Z1211 Encounter for screening for malignant neoplasm of colon: Secondary | ICD-10-CM | POA: Diagnosis not present

## 2021-04-24 LAB — HEMOCCULT GUIAC POC 1CARD (OFFICE): Fecal Occult Blood, POC: NEGATIVE

## 2021-04-24 NOTE — Progress Notes (Signed)
Patient ID: Maria Werner, female   DOB: 1950/11/06, 71 y.o.   MRN: [id number] History of Present Illness: Maria Werner is a 71 year old white female,married, PM in for a well woman gyn exam, she no longer needs a pap. PCP is Dr Willey Blade   Lab Results  Component Value Date   DIAGPAP  02/02/2018    NEGATIVE FOR INTRAEPITHELIAL LESIONS OR MALIGNANCY.  ATROPHY WITH INFLAMMATION (ATROPHIC VAGINITIS).   HPV NOT DETECTED 02/02/2018    Current Medications, Allergies, Past Medical History, Past Surgical History, Family History and Social History were reviewed in [employer] record.     Review of Systems: Patient denies any headaches, hearing loss, fatigue, blurred vision, shortness of breath, chest pain, abdominal pain, problems with bowel movements, urination, or intercourse(not often). No mood swings. Joints hurt at times  Denies any vaginal bleeding Has some itching in vulva area and uses nystatin and kenalog, with relief   Physical Exam:BP 139/83 (BP Location: Right Arm, Patient Position: Sitting, Cuff Size: Normal)    Pulse 78    Ht 5\' 1"  (1.549 m)    Wt 152 lb (68.9 kg)    BMI 28.72 kg/m   General:  Well developed, well nourished, no acute distress Skin:  Warm and dry Neck:  Midline trachea, normal thyroid, good ROM, no lymphadenopathy,no carotid bruits heard Lungs; Clear to auscultation bilaterally Breast:  No dominant palpable mass, retraction, or nipple discharge Cardiovascular: Regular rate and rhythm Abdomen:  Soft, non tender, no hepatosplenomegaly Pelvic:  External genitalia is normal in appearance, no lesions.  The vagina is pale and atrophic, Urethra has no lesions or masses. The cervix is bulbous.  Uterus is felt to be normal size, shape, and contour.  No adnexal masses or tenderness noted.Bladder is non tender, no masses felt. Rectal: Good sphincter tone, no polyps, or hemorrhoids felt.  Hemoccult negative. Extremities/musculoskeletal:  No swelling or  varicosities noted, no clubbing or cyanosis Psych:  No mood changes, alert and cooperative,seems happy AA is 0 Fall risk is low Depression screen [facility] 2/9 04/24/2021 02/02/2018  Decreased Interest 0 0  Down, Depressed, Hopeless 0 0  PHQ - 2 Score 0 0  Altered sleeping 1 -  Tired, decreased energy 1 -  Change in appetite 0 -  Feeling bad or failure about yourself  0 -  Trouble concentrating 0 -  Moving slowly or fidgety/restless 0 -  Suicidal thoughts 0 -  PHQ-9 Score 2 -    GAD 7 : Generalized Anxiety Score 04/24/2021  Nervous, Anxious, on Edge 0  Control/stop worrying 0  Worry too much - different things 0  Trouble relaxing 0  Restless 0  Easily annoyed or irritable 0  Afraid - awful might happen 0  Total GAD 7 Score 0    Examination chaperoned by Celene Squibb LPN   Impression and Plan: 1. Encounter for well woman exam with routine gynecological exam Physical with PCP No more paps needed Mammogram every 1-2 years Colonoscopy this year Labs with PCP  2. Encounter for screening fecal occult blood testing Negative hemoccult

## 2021-05-13 DIAGNOSIS — E785 Hyperlipidemia, unspecified: Secondary | ICD-10-CM | POA: Diagnosis not present

## 2021-05-13 DIAGNOSIS — I10 Essential (primary) hypertension: Secondary | ICD-10-CM | POA: Diagnosis not present

## 2021-05-28 DIAGNOSIS — E1129 Type 2 diabetes mellitus with other diabetic kidney complication: Secondary | ICD-10-CM | POA: Diagnosis not present

## 2021-06-04 DIAGNOSIS — R7309 Other abnormal glucose: Secondary | ICD-10-CM | POA: Diagnosis not present

## 2021-06-04 DIAGNOSIS — E1129 Type 2 diabetes mellitus with other diabetic kidney complication: Secondary | ICD-10-CM | POA: Diagnosis not present

## 2021-06-04 DIAGNOSIS — I10 Essential (primary) hypertension: Secondary | ICD-10-CM | POA: Diagnosis not present

## 2021-06-10 ENCOUNTER — Encounter (INDEPENDENT_AMBULATORY_CARE_PROVIDER_SITE_OTHER): Payer: Self-pay | Admitting: *Deleted

## 2021-06-11 DIAGNOSIS — L821 Other seborrheic keratosis: Secondary | ICD-10-CM | POA: Diagnosis not present

## 2021-06-11 DIAGNOSIS — X32XXXD Exposure to sunlight, subsequent encounter: Secondary | ICD-10-CM | POA: Diagnosis not present

## 2021-06-11 DIAGNOSIS — L570 Actinic keratosis: Secondary | ICD-10-CM | POA: Diagnosis not present

## 2021-07-01 ENCOUNTER — Encounter (INDEPENDENT_AMBULATORY_CARE_PROVIDER_SITE_OTHER): Payer: Self-pay | Admitting: *Deleted

## 2021-07-29 ENCOUNTER — Other Ambulatory Visit (INDEPENDENT_AMBULATORY_CARE_PROVIDER_SITE_OTHER): Payer: Self-pay

## 2021-07-29 ENCOUNTER — Telehealth (INDEPENDENT_AMBULATORY_CARE_PROVIDER_SITE_OTHER): Payer: Self-pay | Admitting: *Deleted

## 2021-07-29 ENCOUNTER — Encounter (INDEPENDENT_AMBULATORY_CARE_PROVIDER_SITE_OTHER): Payer: Self-pay | Admitting: *Deleted

## 2021-07-29 DIAGNOSIS — I10 Essential (primary) hypertension: Secondary | ICD-10-CM

## 2021-07-29 DIAGNOSIS — Z86010 Personal history of colonic polyps: Secondary | ICD-10-CM

## 2021-07-29 MED ORDER — PEG 3350-KCL-NA BICARB-NACL 420 G PO SOLR: 4000.0000 mL | Freq: Once | ORAL | 0 refills | Status: AC

## 2021-07-29 NOTE — Telephone Encounter (Signed)
Patient needs trilyte 

## 2021-07-29 NOTE — Telephone Encounter (Signed)
Referring MD/PCP: fagan ? ?Procedure: tcs ? ?Reason/Indication:  hx polyps ? ?Has patient had this procedure before?  Yes, 2016 ? If so, when, by whom and where?   ? ?Is there a family history of colon cancer?  no ? Who?  What age when diagnosed?   ? ?Is patient diabetic? If yes, Type 1 or Type 2   no ?     ?Does patient have prosthetic heart valve or mechanical valve?  no ? ?Do you have a pacemaker/defibrillator?  no ? ?Has patient ever had endocarditis/atrial fibrillation? no ? ?Does patient use oxygen? no ? ?Has patient had joint replacement within last 12 months?  no ? ?Is patient constipated or do they take laxatives? no ? ?Does patient have a history of alcohol/drug use?  no ? ?Have you had a stroke/heart attack last 6 mths? no ? ?Do you take medicine for weight loss?  no ? ?For female patients,: have you had a hysterectomy no ?                     are you post menopausal  ?                     do you still have your menstrual cycle no ? ?Is patient on blood thinner such as Coumadin, Plavix and/or Aspirin? yes ? ?Medications: asa 81 mg 3 tabs weekly, alendronate 70 mg once a week, calcium 500 mg + D3 bid, hctz 25 mg daily, power zinc 15 mg bid, potassium 20 meq daily, vit c 1000 mg daily, atorvastatin 20 mg daily, omeprazole 20 mg daily ? ?Allergies: nkda ? ?Medication Adjustment per Dr Rehman/Dr Jenetta Downer asa 2 days ? ?Procedure date & time: 08/28/21 ? ? ?

## 2021-08-26 ENCOUNTER — Other Ambulatory Visit (HOSPITAL_COMMUNITY)
Admission: RE | Admit: 2021-08-26 | Discharge: 2021-08-26 | Disposition: A | Discharge status: Home / Self Care | Payer: Medicare HMO | Source: Ambulatory Visit | Attending: Internal Medicine | Admitting: Internal Medicine

## 2021-08-26 DIAGNOSIS — I10 Essential (primary) hypertension: Secondary | ICD-10-CM | POA: Insufficient documentation

## 2021-08-26 LAB — BASIC METABOLIC PANEL
Anion gap: 7 (ref 5–15)
BUN: 8 mg/dL (ref 8–23)
CO2: 29 mmol/L (ref 22–32)
Calcium: 9.1 mg/dL (ref 8.9–10.3)
Chloride: 101 mmol/L (ref 98–111)
Creatinine, Ser: 0.63 mg/dL (ref 0.44–1.00)
GFR, Estimated: >60 mL/min (ref >60)
Glucose, Bld: 177 mg/dL — ABNORMAL HIGH (ref 70–99)
Potassium: 2.9 mmol/L — ABNORMAL LOW (ref 3.5–5.1)
Sodium: 137 mmol/L (ref 135–145)

## 2021-08-28 ENCOUNTER — Ambulatory Visit (HOSPITAL_BASED_OUTPATIENT_CLINIC_OR_DEPARTMENT_OTHER): Payer: Medicare HMO | Admitting: Certified Registered Nurse Anesthetist

## 2021-08-28 ENCOUNTER — Encounter (HOSPITAL_COMMUNITY): Payer: Self-pay | Admitting: Internal Medicine

## 2021-08-28 ENCOUNTER — Ambulatory Visit (HOSPITAL_COMMUNITY)
Admission: RE | Admit: 2021-08-28 | Discharge: 2021-08-28 | Disposition: A | Discharge status: Home / Self Care | Payer: Medicare HMO | Attending: Internal Medicine | Admitting: Internal Medicine

## 2021-08-28 ENCOUNTER — Ambulatory Visit (HOSPITAL_COMMUNITY): Payer: Medicare HMO | Admitting: Certified Registered Nurse Anesthetist

## 2021-08-28 ENCOUNTER — Encounter (HOSPITAL_COMMUNITY)
Admission: RE | Disposition: A | Discharge status: Home / Self Care | Payer: Self-pay | Source: Home / Self Care | Attending: Internal Medicine

## 2021-08-28 ENCOUNTER — Other Ambulatory Visit: Payer: Self-pay

## 2021-08-28 DIAGNOSIS — Z86010 Personal history of colonic polyps: Secondary | ICD-10-CM | POA: Insufficient documentation

## 2021-08-28 DIAGNOSIS — K5730 Diverticulosis of large intestine without perforation or abscess without bleeding: Secondary | ICD-10-CM

## 2021-08-28 DIAGNOSIS — K219 Gastro-esophageal reflux disease without esophagitis: Secondary | ICD-10-CM | POA: Insufficient documentation

## 2021-08-28 DIAGNOSIS — I10 Essential (primary) hypertension: Secondary | ICD-10-CM | POA: Insufficient documentation

## 2021-08-28 DIAGNOSIS — Z09 Encounter for follow-up examination after completed treatment for conditions other than malignant neoplasm: Secondary | ICD-10-CM | POA: Diagnosis not present

## 2021-08-28 DIAGNOSIS — Z87891 Personal history of nicotine dependence: Secondary | ICD-10-CM | POA: Diagnosis not present

## 2021-08-28 DIAGNOSIS — Z1211 Encounter for screening for malignant neoplasm of colon: Secondary | ICD-10-CM | POA: Insufficient documentation

## 2021-08-28 DIAGNOSIS — Z8249 Family history of ischemic heart disease and other diseases of the circulatory system: Secondary | ICD-10-CM | POA: Diagnosis not present

## 2021-08-28 LAB — POCT I-STAT, CHEM 8
BUN: 7 mg/dL — ABNORMAL LOW (ref 8–23)
Calcium, Ion: 1.1 mmol/L — ABNORMAL LOW (ref 1.15–1.40)
Chloride: 102 mmol/L (ref 98–111)
Creatinine, Ser: 0.6 mg/dL (ref 0.44–1.00)
Glucose, Bld: 120 mg/dL — ABNORMAL HIGH (ref 70–99)
HCT: 42 % (ref 36.0–46.0)
Hemoglobin: 14.3 g/dL (ref 12.0–15.0)
Potassium: 3 mmol/L — ABNORMAL LOW (ref 3.5–5.1)
Sodium: 141 mmol/L (ref 135–145)
TCO2: 26 mmol/L (ref 22–32)

## 2021-08-28 LAB — HM COLONOSCOPY

## 2021-08-28 SURGERY — COLONOSCOPY WITH PROPOFOL
Anesthesia: General

## 2021-08-28 MED ORDER — PROPOFOL 10 MG/ML IV BOLUS
INTRAVENOUS | Status: DC | PRN
  Administered 2021-08-28: 90 mg via INTRAVENOUS

## 2021-08-28 MED ORDER — LACTATED RINGERS IV SOLN
INTRAVENOUS | Status: DC
Start: 2021-08-28 — End: 2021-08-28

## 2021-08-28 MED ORDER — PROPOFOL 500 MG/50ML IV EMUL
INTRAVENOUS | Status: DC | PRN
  Administered 2021-08-28: 125 ug/kg/min via INTRAVENOUS

## 2021-08-28 MED ORDER — LIDOCAINE HCL (CARDIAC) PF 100 MG/5ML IV SOSY
PREFILLED_SYRINGE | INTRAVENOUS | Status: DC | PRN
  Administered 2021-08-28: 50 mg via INTRAVENOUS

## 2021-08-28 NOTE — Transfer of Care (Signed)
Immediate Anesthesia Transfer of Care Note  Patient: Maria Werner  Procedure(s) Performed: COLONOSCOPY WITH PROPOFOL  Patient Location: Endoscopy Unit  Anesthesia Type:General  Level of Consciousness: awake  Airway & Oxygen Therapy: Patient Spontanous Breathing  Post-op Assessment: Report given to RN and Post -op Vital signs reviewed and stable  Post vital signs: Reviewed and stable  Last Vitals:  Vitals Value Taken Time  BP    Temp    Pulse 80   Resp 14   SpO2 95%     Last Pain:  Vitals:   08/28/21 1006  TempSrc:   PainSc: 0-No pain      Patients Stated Pain Goal: 6 (21/30/86 5784)  Complications: No notable events documented.

## 2021-08-28 NOTE — H&P (Signed)
Maria Werner is an 71 y.o. female.   Chief Complaint: Patient is here for colonoscopy. HPI: Patient is 71 year old Caucasian female who is here for surveillance colonoscopy.  Her last colonoscopy was in May 2016 with removal of 3 polyps and 1 was tubular adenoma.  She is advised to come back in 7 years.  She denies abdominal pain change in bowel habits or rectal bleeding.  Family history is negative for colon carcinoma.  Last aspirin dose was over a week ago.  Past Medical History:  Diagnosis Date   GERD (gastroesophageal reflux disease)    Hyperlipidemia    Hypertension    Perianal itch 01/30/2015   Post-menopausal atrophic vaginitis 09/28/2012   Vulvar itching 01/30/2015   Yeast infection 09/28/2012    Past Surgical History:  Procedure Laterality Date   BUNIONECTOMY     CARPAL TUNNEL RELEASE Left 02/2014   CESAREAN SECTION     COLONOSCOPY N/A 08/01/2014   Procedure: COLONOSCOPY;  Surgeon: Rogene Houston, MD;  Location: [facility];  Service: Endoscopy;  Laterality: N/A;  39   CYSTOSTOMY W/ BLADDER BIOPSY     DIAGNOSTIC LAPAROSCOPY      Family History  Problem Relation Age of Onset   COPD Mother        emphysema   Heart disease Father    Cancer Father        prostate   Diabetes Father    Diabetes Sister    Cancer - Colon Neg Hx    Social History:  reports that she has quit smoking. Her smoking use included cigarettes. She smoked an average of 1 pack per day. She has never used smokeless tobacco. She reports that she does not drink alcohol and does not use drugs.  Allergies: No Known Allergies  Medications Prior to Admission  Medication Sig Dispense Refill   acetaminophen (TYLENOL) 500 MG tablet Take 1,000 mg by mouth every 6 (six) hours as needed for moderate pain.     alendronate (FOSAMAX) 70 MG tablet Take 70 mg by mouth every Sunday. Take with a full glass of water on an empty stomach.     Ascorbic Acid (VITAMIN C) 1000 MG tablet Take 1,000 mg by mouth daily.      aspirin EC 81 MG tablet Take 81 mg by mouth 3 (three) times a week.     atorvastatin (LIPITOR) 20 MG tablet Take 10 mg by mouth every evening.     bismuth subsalicylate (PEPTO BISMOL) 262 MG/15ML suspension Take 30 mLs by mouth every 6 (six) hours as needed for indigestion or diarrhea or loose stools.     Calcium Carb-Cholecalciferol (CALCIUM 500 + D PO) Take 2 tablets by mouth daily.     calcium carbonate (TUMS - DOSED IN MG ELEMENTAL CALCIUM) 500 MG chewable tablet Chew 3 tablets by mouth daily as needed for indigestion or heartburn.     hydrochlorothiazide (HYDRODIURIL) 25 MG tablet Take 25 mg by mouth daily.      KLOR-CON M20 20 MEQ tablet Take 20 mEq by mouth daily.      nystatin cream (MYCOSTATIN) Apply 1 application. topically 2 (two) times daily as needed (itching). Mixed with triamcinolone     omeprazole (PRILOSEC) 20 MG capsule Take 20 mg by mouth daily.      triamcinolone cream (KENALOG) 0.1 % Apply 1 application. topically 2 (two) times daily as needed (itching). Mixed with nystatin     Zinc Sulfate (ZINC 15 PO) Take 30 mg by mouth daily.  Results for orders placed or performed during the hospital encounter of 08/26/21 (from the past 48 hour(s))  Basic Metabolic Panel (BMET)     Status: Abnormal   Collection Time: 08/26/21  2:18 PM  Result Value Ref Range   Sodium 137 135 - 145 mmol/L   Potassium 2.9 (L) 3.5 - 5.1 mmol/L   Chloride 101 98 - 111 mmol/L   CO2 29 22 - 32 mmol/L   Glucose, Bld 177 (H) 70 - 99 mg/dL    Comment: Glucose reference range applies only to samples taken after fasting for at least 8 hours.   BUN 8 8 - 23 mg/dL   Creatinine, Ser 0.63 0.44 - 1.00 mg/dL   Calcium 9.1 8.9 - 10.3 mg/dL   GFR, Estimated >60 >60 mL/min    Comment: (NOTE) Calculated using the CKD-EPI Creatinine Equation (2021)    Anion gap 7 5 - 15    Comment: Performed at [facility], [address] [place], [place] [address]   No results found.  Review of Systems  Blood  pressure (!) 143/74, pulse 83, temperature 97.7 F (36.5 C), temperature source Oral, resp. rate (!) 21, height '5\' 2"'$  (1.575 m), weight 68 kg, SpO2 96 %. Physical Exam HENT:     Mouth/Throat:     Mouth: Mucous membranes are moist.     Pharynx: Oropharynx is clear.  Eyes:     General: No scleral icterus.    Conjunctiva/sclera: Conjunctivae normal.  Cardiovascular:     Rate and Rhythm: Normal rate and regular rhythm.     Heart sounds: Normal heart sounds. No murmur heard. Pulmonary:     Effort: Pulmonary effort is normal.     Breath sounds: Normal breath sounds.  Abdominal:     General: There is no distension.     Palpations: Abdomen is soft. There is no mass.     Tenderness: There is no abdominal tenderness.  Musculoskeletal:     Cervical back: Neck supple.  Lymphadenopathy:     Cervical: No cervical adenopathy.  Neurological:     Mental Status: She is alert.      Assessment/Plan  History of colonic adenoma Surveillance colonoscopy.  Hildred Laser, MD 08/28/2021, 10:01 AM

## 2021-08-28 NOTE — Anesthesia Preprocedure Evaluation (Signed)
Anesthesia Evaluation  Patient identified by MRN, date of birth, ID band Patient awake    Reviewed: Allergy & Precautions, H&P , NPO status , Patient's Chart, lab work & pertinent test results, reviewed documented beta blocker date and time   Airway Mallampati: II  TM Distance: >3 FB Neck ROM: full    Dental no notable dental hx.    Pulmonary neg pulmonary ROS, former smoker,    Pulmonary exam normal breath sounds clear to auscultation       Cardiovascular Exercise Tolerance: Good hypertension, negative cardio ROS   Rhythm:regular Rate:Normal     Neuro/Psych negative neurological ROS  negative psych ROS   GI/Hepatic Neg liver ROS, GERD  Medicated,  Endo/Other  negative endocrine ROS  Renal/GU negative Renal ROS  negative genitourinary   Musculoskeletal   Abdominal   Peds  Hematology negative hematology ROS (+)   Anesthesia Other Findings   Reproductive/Obstetrics negative OB ROS                             Anesthesia Physical Anesthesia Plan  ASA: 2  Anesthesia Plan: General   Post-op Pain Management:    Induction:   PONV Risk Score and Plan: Propofol infusion  Airway Management Planned:   Additional Equipment:   Intra-op Plan:   Post-operative Plan:   Informed Consent: I have reviewed the patients History and Physical, chart, labs and discussed the procedure including the risks, benefits and alternatives for the proposed anesthesia with the patient or authorized representative who has indicated his/her understanding and acceptance.     Dental Advisory Given  Plan Discussed with: CRNA  Anesthesia Plan Comments:         Anesthesia Quick Evaluation  

## 2021-08-28 NOTE — Op Note (Signed)
[facility] Patient Name: Maria Werner Procedure Date: 08/28/2021 9:43 AM MRN: [id number] Date of Birth: 07-25-1950 Attending MD: Hildred Laser , MD CSN: [id number] Age: 71 Admit Type: Outpatient Procedure:                Colonoscopy Indications:              High risk colon cancer surveillance: Personal                            history of colonic polyps Providers:                Hildred Laser, MD, Lambert Mody, Caprice Kluver Referring MD:             Asencion Noble, MD Medicines:                Propofol per Anesthesia Complications:            No immediate complications. Estimated Blood Loss:     Estimated blood loss: none. Procedure:                Pre-Anesthesia Assessment:                           - Prior to the procedure, a History and Physical                            was performed, and patient medications and                            allergies were reviewed. The patient's tolerance of                            previous anesthesia was also reviewed. The risks                            and benefits of the procedure and the sedation                            options and risks were discussed with the patient.                            All questions were answered, and informed consent                            was obtained. Prior Anticoagulants: The patient has                            taken no previous anticoagulant or antiplatelet                            agents except for aspirin. ASA Grade Assessment: II                            - A patient with mild systemic disease. After  reviewing the risks and benefits, the patient was                            deemed in satisfactory condition to undergo the                            procedure.                           After obtaining informed consent, the colonoscope                            was passed under direct vision. Throughout the                            procedure, the  patient's blood pressure, pulse, and                            oxygen saturations were monitored continuously. The                            [id number]-HQ190L ([id number]) scope was introduced through                            the anus and advanced to the the cecum, identified                            by appendiceal orifice and ileocecal valve. The                            colonoscopy was performed without difficulty. The                            patient tolerated the procedure well. The quality                            of the bowel preparation was adequate. The                            ileocecal valve, appendiceal orifice, and rectum                            were photographed. Scope In: 10:10:37 AM Scope Out: 10:28:21 AM Scope Withdrawal Time: 0 hours 8 minutes 51 seconds  Total Procedure Duration: 0 hours 17 minutes 44 seconds  Findings:      The perianal and digital rectal examinations were normal.      A few diverticula were found in the sigmoid colon.      The exam was otherwise normal throughout the examined colon.      The retroflexed view of the distal rectum and anal verge was normal and       showed no anal or rectal abnormalities. Impression:               - Diverticulosis in the sigmoid colon.                           -  No specimens collected. Moderate Sedation:      Per Anesthesia Care Recommendation:           - Patient has a contact number available for                            emergencies. The signs and symptoms of potential                            delayed complications were discussed with the                            patient. Return to normal activities tomorrow.                            Written discharge instructions were provided to the                            patient.                           - High fiber diet today.                           - Continue present medications.                           - No repeat colonoscopy due to age and the  absence                            of advanced adenomas. Procedure Code(s):        --- Professional ---                           [phone], Colonoscopy, flexible; diagnostic, including                            collection of specimen(s) by brushing or washing,                            when performed (separate procedure) Diagnosis Code(s):        --- Professional ---                           Z86.010, Personal history of colonic polyps                           K57.30, Diverticulosis of large intestine without                            perforation or abscess without bleeding CPT copyright 2019 American Medical Association. All rights reserved. The codes documented in this report are preliminary and upon coder review may  be revised to meet current compliance requirements. Hildred Laser, MD Hildred Laser, MD 08/28/2021 10:36:42 AM This report has been signed electronically. Number of Addenda: 0

## 2021-08-28 NOTE — Discharge Instructions (Addendum)
Resume usual medication including aspirin as before. High-fiber diet. No driving for 24 hours. Continue KCl 20 mEq twice daily for rest of the week and then once daily. Please have Dr. Willey Blade check your electrolytes and serum magnesium within the next 2 weeks.

## 2021-08-29 NOTE — Anesthesia Postprocedure Evaluation (Signed)
Anesthesia Post Note  Patient: Maria Werner  Procedure(s) Performed: COLONOSCOPY WITH PROPOFOL  Patient location during evaluation: Phase II Anesthesia Type: General Level of consciousness: awake Pain management: pain level controlled Vital Signs Assessment: post-procedure vital signs reviewed and stable Respiratory status: spontaneous breathing and respiratory function stable Cardiovascular status: blood pressure returned to baseline and stable Postop Assessment: no headache and no apparent nausea or vomiting Anesthetic complications: no Comments: Late entry   No notable events documented.   Last Vitals:  Vitals:   08/28/21 0852 08/28/21 1030  BP: (!) 143/74 (!) 119/54  Pulse: 83 79  Resp: (!) 21 20  Temp: 36.5 C (!) 36.3 C  SpO2: 96% (!) 21%    Last Pain:  Vitals:   08/28/21 1030  TempSrc:   PainSc: 0-No pain                 Louann Sjogren

## 2021-09-02 ENCOUNTER — Encounter (INDEPENDENT_AMBULATORY_CARE_PROVIDER_SITE_OTHER): Payer: Self-pay | Admitting: *Deleted

## 2021-09-03 DIAGNOSIS — Z79899 Other long term (current) drug therapy: Secondary | ICD-10-CM | POA: Diagnosis not present

## 2021-09-03 DIAGNOSIS — E876 Hypokalemia: Secondary | ICD-10-CM | POA: Diagnosis not present

## 2021-09-04 ENCOUNTER — Encounter (HOSPITAL_COMMUNITY): Payer: Self-pay | Admitting: Internal Medicine

## 2021-09-30 DIAGNOSIS — Z79899 Other long term (current) drug therapy: Secondary | ICD-10-CM | POA: Diagnosis not present

## 2021-09-30 DIAGNOSIS — M810 Age-related osteoporosis without current pathological fracture: Secondary | ICD-10-CM | POA: Diagnosis not present

## 2021-09-30 DIAGNOSIS — E1129 Type 2 diabetes mellitus with other diabetic kidney complication: Secondary | ICD-10-CM | POA: Diagnosis not present

## 2021-09-30 DIAGNOSIS — I10 Essential (primary) hypertension: Secondary | ICD-10-CM | POA: Diagnosis not present

## 2021-09-30 DIAGNOSIS — E785 Hyperlipidemia, unspecified: Secondary | ICD-10-CM | POA: Diagnosis not present

## 2021-10-07 ENCOUNTER — Other Ambulatory Visit (HOSPITAL_COMMUNITY): Payer: Self-pay | Admitting: Internal Medicine

## 2021-10-07 DIAGNOSIS — M810 Age-related osteoporosis without current pathological fracture: Secondary | ICD-10-CM | POA: Diagnosis not present

## 2021-10-07 DIAGNOSIS — Z780 Asymptomatic menopausal state: Secondary | ICD-10-CM

## 2021-10-07 DIAGNOSIS — I10 Essential (primary) hypertension: Secondary | ICD-10-CM | POA: Diagnosis not present

## 2021-10-07 DIAGNOSIS — E1122 Type 2 diabetes mellitus with diabetic chronic kidney disease: Secondary | ICD-10-CM | POA: Diagnosis not present

## 2021-10-07 DIAGNOSIS — E785 Hyperlipidemia, unspecified: Secondary | ICD-10-CM | POA: Diagnosis not present

## 2021-10-13 DIAGNOSIS — X32XXXD Exposure to sunlight, subsequent encounter: Secondary | ICD-10-CM | POA: Diagnosis not present

## 2021-10-13 DIAGNOSIS — L570 Actinic keratosis: Secondary | ICD-10-CM | POA: Diagnosis not present

## 2021-10-13 DIAGNOSIS — Z1283 Encounter for screening for malignant neoplasm of skin: Secondary | ICD-10-CM | POA: Diagnosis not present

## 2021-10-13 DIAGNOSIS — D225 Melanocytic nevi of trunk: Secondary | ICD-10-CM | POA: Diagnosis not present

## 2021-10-21 ENCOUNTER — Other Ambulatory Visit (HOSPITAL_COMMUNITY): Payer: Medicare HMO

## 2021-10-28 ENCOUNTER — Ambulatory Visit (HOSPITAL_COMMUNITY)
Admission: RE | Admit: 2021-10-28 | Discharge: 2021-10-28 | Disposition: A | Discharge status: Home / Self Care | Payer: Medicare HMO | Source: Ambulatory Visit | Attending: Internal Medicine | Admitting: Internal Medicine

## 2021-10-28 DIAGNOSIS — Z780 Asymptomatic menopausal state: Secondary | ICD-10-CM | POA: Diagnosis not present

## 2021-10-28 DIAGNOSIS — M85852 Other specified disorders of bone density and structure, left thigh: Secondary | ICD-10-CM | POA: Diagnosis not present

## 2021-10-28 DIAGNOSIS — M85831 Other specified disorders of bone density and structure, right forearm: Secondary | ICD-10-CM | POA: Diagnosis not present

## 2021-10-31 DIAGNOSIS — N189 Chronic kidney disease, unspecified: Secondary | ICD-10-CM | POA: Diagnosis not present

## 2021-10-31 DIAGNOSIS — Z008 Encounter for other general examination: Secondary | ICD-10-CM | POA: Diagnosis not present

## 2021-10-31 DIAGNOSIS — I129 Hypertensive chronic kidney disease with stage 1 through stage 4 chronic kidney disease, or unspecified chronic kidney disease: Secondary | ICD-10-CM | POA: Diagnosis not present

## 2021-10-31 DIAGNOSIS — E785 Hyperlipidemia, unspecified: Secondary | ICD-10-CM | POA: Diagnosis not present

## 2021-10-31 DIAGNOSIS — E1122 Type 2 diabetes mellitus with diabetic chronic kidney disease: Secondary | ICD-10-CM | POA: Diagnosis not present

## 2021-10-31 DIAGNOSIS — R32 Unspecified urinary incontinence: Secondary | ICD-10-CM | POA: Diagnosis not present

## 2021-10-31 DIAGNOSIS — E876 Hypokalemia: Secondary | ICD-10-CM | POA: Diagnosis not present

## 2021-10-31 DIAGNOSIS — K219 Gastro-esophageal reflux disease without esophagitis: Secondary | ICD-10-CM | POA: Diagnosis not present

## 2021-10-31 DIAGNOSIS — Z87891 Personal history of nicotine dependence: Secondary | ICD-10-CM | POA: Diagnosis not present

## 2021-10-31 DIAGNOSIS — Z8249 Family history of ischemic heart disease and other diseases of the circulatory system: Secondary | ICD-10-CM | POA: Diagnosis not present

## 2021-10-31 DIAGNOSIS — M1990 Unspecified osteoarthritis, unspecified site: Secondary | ICD-10-CM | POA: Diagnosis not present

## 2021-10-31 DIAGNOSIS — M810 Age-related osteoporosis without current pathological fracture: Secondary | ICD-10-CM | POA: Diagnosis not present

## 2021-10-31 DIAGNOSIS — Z7983 Long term (current) use of bisphosphonates: Secondary | ICD-10-CM | POA: Diagnosis not present

## 2022-01-14 DIAGNOSIS — Z23 Encounter for immunization: Secondary | ICD-10-CM | POA: Diagnosis not present

## 2022-01-19 DIAGNOSIS — E1129 Type 2 diabetes mellitus with other diabetic kidney complication: Secondary | ICD-10-CM | POA: Diagnosis not present

## 2022-01-26 DIAGNOSIS — I10 Essential (primary) hypertension: Secondary | ICD-10-CM | POA: Diagnosis not present

## 2022-01-26 DIAGNOSIS — R7309 Other abnormal glucose: Secondary | ICD-10-CM | POA: Diagnosis not present

## 2022-01-26 DIAGNOSIS — K219 Gastro-esophageal reflux disease without esophagitis: Secondary | ICD-10-CM | POA: Diagnosis not present

## 2022-01-26 DIAGNOSIS — E1129 Type 2 diabetes mellitus with other diabetic kidney complication: Secondary | ICD-10-CM | POA: Diagnosis not present

## 2022-03-10 ENCOUNTER — Other Ambulatory Visit: Payer: Self-pay | Admitting: Adult Health

## 2022-03-23 ENCOUNTER — Other Ambulatory Visit (HOSPITAL_COMMUNITY): Payer: Self-pay | Admitting: Internal Medicine

## 2022-03-23 DIAGNOSIS — Z1231 Encounter for screening mammogram for malignant neoplasm of breast: Secondary | ICD-10-CM

## 2022-04-06 ENCOUNTER — Ambulatory Visit (HOSPITAL_COMMUNITY): Payer: Medicare HMO

## 2022-04-09 DIAGNOSIS — E119 Type 2 diabetes mellitus without complications: Secondary | ICD-10-CM | POA: Diagnosis not present

## 2022-04-13 ENCOUNTER — Ambulatory Visit (HOSPITAL_COMMUNITY)
Admission: RE | Admit: 2022-04-13 | Discharge: 2022-04-13 | Disposition: A | Discharge status: Home / Self Care | Payer: Medicare HMO | Source: Ambulatory Visit | Attending: Internal Medicine | Admitting: Internal Medicine

## 2022-04-13 DIAGNOSIS — Z1231 Encounter for screening mammogram for malignant neoplasm of breast: Secondary | ICD-10-CM | POA: Diagnosis not present

## 2022-04-15 DIAGNOSIS — X32XXXD Exposure to sunlight, subsequent encounter: Secondary | ICD-10-CM | POA: Diagnosis not present

## 2022-04-15 DIAGNOSIS — Z1283 Encounter for screening for malignant neoplasm of skin: Secondary | ICD-10-CM | POA: Diagnosis not present

## 2022-04-15 DIAGNOSIS — D225 Melanocytic nevi of trunk: Secondary | ICD-10-CM | POA: Diagnosis not present

## 2022-04-15 DIAGNOSIS — L570 Actinic keratosis: Secondary | ICD-10-CM | POA: Diagnosis not present

## 2022-04-15 DIAGNOSIS — R69 Illness, unspecified: Secondary | ICD-10-CM | POA: Diagnosis not present

## 2022-06-11 DIAGNOSIS — E1129 Type 2 diabetes mellitus with other diabetic kidney complication: Secondary | ICD-10-CM | POA: Diagnosis not present

## 2022-06-18 DIAGNOSIS — I10 Essential (primary) hypertension: Secondary | ICD-10-CM | POA: Diagnosis not present

## 2022-06-18 DIAGNOSIS — E1122 Type 2 diabetes mellitus with diabetic chronic kidney disease: Secondary | ICD-10-CM | POA: Diagnosis not present

## 2022-06-18 DIAGNOSIS — R7309 Other abnormal glucose: Secondary | ICD-10-CM | POA: Diagnosis not present

## 2022-09-16 DIAGNOSIS — K219 Gastro-esophageal reflux disease without esophagitis: Secondary | ICD-10-CM | POA: Diagnosis not present

## 2022-09-16 DIAGNOSIS — I10 Essential (primary) hypertension: Secondary | ICD-10-CM | POA: Diagnosis not present

## 2022-09-16 DIAGNOSIS — R32 Unspecified urinary incontinence: Secondary | ICD-10-CM | POA: Diagnosis not present

## 2022-09-16 DIAGNOSIS — E785 Hyperlipidemia, unspecified: Secondary | ICD-10-CM | POA: Diagnosis not present

## 2022-09-16 DIAGNOSIS — E876 Hypokalemia: Secondary | ICD-10-CM | POA: Diagnosis not present

## 2022-09-16 DIAGNOSIS — Z87891 Personal history of nicotine dependence: Secondary | ICD-10-CM | POA: Diagnosis not present

## 2022-09-23 DIAGNOSIS — R002 Palpitations: Secondary | ICD-10-CM | POA: Diagnosis not present

## 2022-09-23 DIAGNOSIS — R0789 Other chest pain: Secondary | ICD-10-CM | POA: Diagnosis not present

## 2022-10-12 DIAGNOSIS — E1129 Type 2 diabetes mellitus with other diabetic kidney complication: Secondary | ICD-10-CM | POA: Diagnosis not present

## 2022-10-12 DIAGNOSIS — K219 Gastro-esophageal reflux disease without esophagitis: Secondary | ICD-10-CM | POA: Diagnosis not present

## 2022-10-12 DIAGNOSIS — I10 Essential (primary) hypertension: Secondary | ICD-10-CM | POA: Diagnosis not present

## 2022-10-12 DIAGNOSIS — E785 Hyperlipidemia, unspecified: Secondary | ICD-10-CM | POA: Diagnosis not present

## 2022-10-12 DIAGNOSIS — Z79899 Other long term (current) drug therapy: Secondary | ICD-10-CM | POA: Diagnosis not present

## 2022-10-12 DIAGNOSIS — M810 Age-related osteoporosis without current pathological fracture: Secondary | ICD-10-CM | POA: Diagnosis not present

## 2022-10-19 DIAGNOSIS — E1122 Type 2 diabetes mellitus with diabetic chronic kidney disease: Secondary | ICD-10-CM | POA: Diagnosis not present

## 2022-10-19 DIAGNOSIS — I10 Essential (primary) hypertension: Secondary | ICD-10-CM | POA: Diagnosis not present

## 2022-10-19 DIAGNOSIS — K219 Gastro-esophageal reflux disease without esophagitis: Secondary | ICD-10-CM | POA: Diagnosis not present

## 2022-10-19 DIAGNOSIS — Z0000 Encounter for general adult medical examination without abnormal findings: Secondary | ICD-10-CM | POA: Diagnosis not present

## 2022-10-19 DIAGNOSIS — M810 Age-related osteoporosis without current pathological fracture: Secondary | ICD-10-CM | POA: Diagnosis not present

## 2022-10-20 ENCOUNTER — Encounter: Payer: Self-pay | Admitting: Internal Medicine

## 2022-11-10 ENCOUNTER — Other Ambulatory Visit (HOSPITAL_COMMUNITY): Payer: Self-pay | Admitting: Internal Medicine

## 2022-11-10 DIAGNOSIS — R059 Cough, unspecified: Secondary | ICD-10-CM

## 2022-11-11 ENCOUNTER — Ambulatory Visit (HOSPITAL_COMMUNITY)
Admission: RE | Admit: 2022-11-11 | Discharge: 2022-11-11 | Disposition: A | Discharge status: Home / Self Care | Payer: Medicare HMO | Source: Ambulatory Visit | Attending: Internal Medicine | Admitting: Internal Medicine

## 2022-11-11 DIAGNOSIS — R0602 Shortness of breath: Secondary | ICD-10-CM | POA: Diagnosis not present

## 2022-11-11 DIAGNOSIS — R059 Cough, unspecified: Secondary | ICD-10-CM | POA: Diagnosis not present

## 2022-12-01 ENCOUNTER — Other Ambulatory Visit (HOSPITAL_COMMUNITY)
Admission: RE | Admit: 2022-12-01 | Discharge: 2022-12-01 | Disposition: A | Discharge status: Home / Self Care | Payer: Medicare HMO | Source: Ambulatory Visit | Attending: Internal Medicine | Admitting: Internal Medicine

## 2022-12-01 ENCOUNTER — Other Ambulatory Visit: Payer: Self-pay | Admitting: Internal Medicine

## 2022-12-01 ENCOUNTER — Ambulatory Visit: Payer: Medicare HMO | Attending: Internal Medicine | Admitting: Internal Medicine

## 2022-12-01 ENCOUNTER — Ambulatory Visit: Payer: Medicare HMO

## 2022-12-01 ENCOUNTER — Encounter: Payer: Self-pay | Admitting: Internal Medicine

## 2022-12-01 VITALS — BP 152/80 | HR 76 | Ht 61.0 in | Wt 153.0 lb

## 2022-12-01 DIAGNOSIS — R079 Chest pain, unspecified: Secondary | ICD-10-CM

## 2022-12-01 DIAGNOSIS — R0789 Other chest pain: Secondary | ICD-10-CM

## 2022-12-01 DIAGNOSIS — Z79899 Other long term (current) drug therapy: Secondary | ICD-10-CM

## 2022-12-01 DIAGNOSIS — Z13220 Encounter for screening for lipoid disorders: Secondary | ICD-10-CM

## 2022-12-01 LAB — BASIC METABOLIC PANEL
Anion gap: 11 (ref 5–15)
BUN: 10 mg/dL (ref 8–23)
CO2: 28 mmol/L (ref 22–32)
Calcium: 9.6 mg/dL (ref 8.9–10.3)
Chloride: 99 mmol/L (ref 98–111)
Creatinine, Ser: 0.63 mg/dL (ref 0.44–1.00)
GFR, Estimated: >60 mL/min (ref >60)
Glucose, Bld: 171 mg/dL — ABNORMAL HIGH (ref 70–99)
Potassium: 3.4 mmol/L — ABNORMAL LOW (ref 3.5–5.1)
Sodium: 138 mmol/L (ref 135–145)

## 2022-12-01 MED ORDER — ATORVASTATIN CALCIUM 20 MG PO TABS: 20.0000 mg | ORAL_TABLET | Freq: Every day | ORAL | 3 refills | Status: DC

## 2022-12-01 MED ORDER — METOPROLOL TARTRATE 100 MG PO TABS: 100.0000 mg | ORAL_TABLET | ORAL | 0 refills | Status: DC

## 2022-12-01 NOTE — Patient Instructions (Addendum)
Medication Instructions:   Increase Lipitor to 20 mg Daily with supper   Take Lopressor 100 mg 2 Hours prior to your CT Scan   *If you need a refill on your cardiac medications before your next appointment, please call your pharmacy*   Lab Work: Your physician recommends that you return for lab work in: Today  Your physician recommends that you return for lab work in: 2 Months   If you have labs (blood work) drawn today and your tests are completely normal, you will receive your results only by: MyChart Message (if you have MyChart) OR A paper copy in the mail If you have any lab test that is abnormal or we need to change your treatment, we will call you to review the results.   Testing/Procedures:   Your cardiac CT will be scheduled at one of the below locations:   [facility] [address] [place], [place] [address] [phone]  OR  [facility] [address] [place], [place] [address] [phone]  OR   [facility] [address] [place], [place] [address] [phone]  If scheduled at [facility], please arrive at the [facility] Entrance (Entrance C2) of [facility] 30 minutes prior to test start time. You can use the FREE valet parking offered at entrance C (encouraged to control the heart rate for the test)  Proceed to the [facility] Radiology Department (first floor) to check-in and test prep.  All radiology patients and guests should use entrance C2 at [facility], accessed from [facility], even though the hospital's physical address listed is [address].    If scheduled at [facility] or [facility], please arrive 15 mins early for check-in and test prep.  There is spacious parking and easy access to the radiology department from the [facility]  entrance. Please enter here and check-in with the desk attendant.   Please follow these instructions carefully (unless otherwise directed):  An IV will be required for this test and Nitroglycerin will be given.  Hold all erectile dysfunction medications at least 3 days (72 hrs) prior to test. (Ie viagra, cialis, sildenafil, tadalafil, etc)   On the Night Before the Test: Be sure to Drink plenty of water. Do not consume any caffeinated/decaffeinated beverages or chocolate 12 hours prior to your test. Do not take any antihistamines 12 hours prior to your test. If the patient has contrast allergy: Patient will need a prescription for Prednisone and very clear instructions (as follows): Prednisone 50 mg - take 13 hours prior to test Take another Prednisone 50 mg 7 hours prior to test Take another Prednisone 50 mg 1 hour prior to test Take Benadryl 50 mg 1 hour prior to test Patient must complete all four doses of above prophylactic medications. Patient will need a ride after test due to Benadryl.  On the Day of the Test: Drink plenty of water until 1 hour prior to the test. Do not eat any food 1 hour prior to test. You may take your regular medications prior to the test.  Take metoprolol (Lopressor) two hours prior to test. If you take Furosemide/Hydrochlorothiazide/Spironolactone, please HOLD on the morning of the test. FEMALES- please wear underwire-free bra if available, avoid dresses & tight clothing  *For Clinical Staff only. Please instruct patient the following:* Heart Rate Medication Recommendations for Cardiac CT  Resting HR <  50 bpm  No medication  Resting HR 50-60 bpm and BP >110/50 mmHG   Consider Metoprolol tartrate 25 mg PO 90-120 min prior to scan  Resting HR 60-65 bpm and BP >110/50 mmHG  Metoprolol tartrate 50 mg PO 90-120 minutes prior to scan   Resting HR > 65 bpm and BP >110/50 mmHG  Metoprolol tartrate 100 mg PO 90-120 minutes prior to scan  Consider Ivabradine  10-15 mg PO or a calcium channel blocker for resting HR >60 bpm and contraindication to metoprolol tartrate  Consider Ivabradine 10-15 mg PO in combination with metoprolol tartrate for HR >80 bpm        After the Test: Drink plenty of water. After receiving IV contrast, you may experience a mild flushed feeling. This is normal. On occasion, you may experience a mild rash up to 24 hours after the test. This is not dangerous. If this occurs, you can take Benadryl 25 mg and increase your fluid intake. If you experience trouble breathing, this can be serious. If it is severe call 911 IMMEDIATELY. If it is mild, please call our office. If you take any of these medications: Glipizide/Metformin, Avandament, Glucavance, please do not take 48 hours after completing test unless otherwise instructed.  We will call to schedule your test 2-4 weeks out understanding that some insurance companies will need an authorization prior to the service being performed.   For more information and frequently asked questions, please visit our website : [email or web address]  For non-scheduling related questions, please contact the cardiac imaging nurse navigator should you have any questions/concerns: Cardiac Imaging Nurse Navigators Direct Office Dial: [phone]   For scheduling needs, including cancellations and rescheduling, please call [place], [phone].    Follow-Up: At [facility], you and your health needs are our priority.  As part of our continuing mission to provide you with exceptional heart care, we have created designated Provider Care Teams.  These Care Teams include your primary Cardiologist (physician) and Advanced Practice Providers (APPs -  Physician Assistants and Nurse Practitioners) who all work together to provide you with the care you need, when you need it.  We recommend signing up for the patient portal called "MyChart".  Sign up information is provided on this  After Visit Summary.  MyChart is used to connect with patients for Virtual Visits (Telemedicine).  Patients are able to view lab/test results, encounter notes, upcoming appointments, etc.  Non-urgent messages can be sent to your provider as well.   To learn more about what you can do with MyChart, go to [email or web address].    Your next appointment:    To Be Determined   Provider:   You may see Dietrich Pates, MD or one of the following Advanced Practice Providers on your designated Care Team:   Randall An, PA-C  Jacolyn Reedy, PA-C     Other Instructions Thank you for choosing [facility]!  ZIO XT- Long Term Monitor Instructions   Your physician has requested you wear your ZIO patch monitor____28___days.   This is a single patch monitor.  [employer] supplies one patch monitor per enrollment.  Additional stickers are not available.   Please do not apply patch if you will be having a Nuclear Stress Test, Echocardiogram, Cardiac CT, MRI, or Chest Xray during the time frame you would be wearing the monitor. The patch cannot be worn during these tests.  You cannot remove and re-apply the ZIO XT patch monitor.   Your ZIO patch monitor will be sent  USPS Priority mail from [employer] directly to your home address. The monitor may also be mailed to a PO BOX if home delivery is not available.   It may take 3-5 days to receive your monitor after you have been enrolled.   Once you have received you monitor, please review enclosed instructions.  Your monitor has already been registered assigning a specific monitor serial # to you.   Applying the monitor   Shave hair from upper left chest.   Hold abrader disc by orange tab.  Rub abrader in 40 strokes over left upper chest as indicated in your monitor instructions.   Clean area with 4 enclosed alcohol pads .  Use all pads to assure are is cleaned thoroughly.  Let dry.   Apply patch as indicated in monitor instructions.   Patch will be place under collarbone on left side of chest with arrow pointing upward.   Rub patch adhesive wings for 2 minutes.Remove white label marked "1".  Remove white label marked "2".  Rub patch adhesive wings for 2 additional minutes.   While looking in a mirror, press and release button in center of patch.  A small green light will flash 3-4 times .  This will be your only indicator the monitor has been turned on.     Do not shower for the first 24 hours.  You may shower after the first 24 hours.   Press button if you feel a symptom. You will hear a small click.  Record Date, Time and Symptom in the Patient Log Book.   When you are ready to remove patch, follow instructions on last 2 pages of Patient Log Book.  Stick patch monitor onto last page of Patient Log Book.   Place Patient Log Book in [place] box.  Use locking tab on box and tape box closed securely.  The [employer] has [employer] on it.  Please place in mailbox as soon as possible.  Your physician should have your test results approximately 7 days after the monitor has been mailed back to [facility].   Call [facility] Customer Care at [phone] if you have questions regarding your ZIO XT patch monitor.  Call them immediately if you see an orange light blinking on your monitor.   If your monitor falls off in less than 4 days contact our Monitor department at [phone].  If your monitor becomes loose or falls off after 4 days call [employer] at [phone] for suggestions on securing your monitor.

## 2022-12-01 NOTE — Progress Notes (Signed)
BMET ordered to follow-up on potassium of 3.4 on 12/01/22. Patient will go to [facility] in [place] to have drawn next week. Lab order released.

## 2022-12-01 NOTE — Progress Notes (Addendum)
+   Cardiology Office Note   Date:  12/01/2022   ID:  Elizandra Killilea, DOB 03-29-1950, MRN [id number]  PCP:  Carylon Perches, MD  Cardiologist:   Dietrich Pates, MD   Patient referred for CP      History of Present Illness: Maria Werner is a 72 y.o. female with a history of HTN, T2DM, HL   Follows with R Fagan   Referred for CP     Pt had CT of chest in 2019 that showed coronary calcifications, atherosclerosis of aorta  Pt says in June she was working in yard  Got hot   Felt heart skipping and then felt tight in chest    Sat down   Went into house   Improved     Since then has had spells erratically    On 11/25/22 her BP cuff said heart was "Irreg"     When not having spells she says she feels OK   Will have occasional chest tightness with stairs, but not all the time   Husband is with her today, says she has slowed in her activity over the past year     Pt notes occasional dizziness  Get s dizzy with walking at times   BP at home   110s to 120s    Diet  Br  Bacon/eggs/toast   Coffee  Sugar and milk Lunch  Pepsi (decaf), Sandwich with tomatot and ham  Chips Dinner   Stew   Slaw Bread   Tea  Almost no sugar     Current Meds  Medication Sig   acetaminophen (TYLENOL) 500 MG tablet Take 1,000 mg by mouth every 6 (six) hours as needed for moderate pain.   Ascorbic Acid (VITAMIN C) 1000 MG tablet Take 1,000 mg by mouth daily.   atorvastatin (LIPITOR) 10 MG tablet Take 10 mg by mouth daily.   bismuth subsalicylate (PEPTO BISMOL) 262 MG/15ML suspension Take 30 mLs by mouth every 6 (six) hours as needed for indigestion or diarrhea or loose stools.   Calcium Carb-Cholecalciferol (CALCIUM 500 + D PO) Take 2 tablets by mouth daily.   calcium carbonate (TUMS - DOSED IN MG ELEMENTAL CALCIUM) 500 MG chewable tablet Chew 3 tablets by mouth daily as needed for indigestion or heartburn.   hydrochlorothiazide (HYDRODIURIL) 25 MG tablet Take 25 mg by mouth daily.    KLOR-CON M20 20 MEQ tablet  Take 20 mEq by mouth daily.    nystatin cream (MYCOSTATIN) APPLY  CREAM TOPICALLY TO AFFECTED AREA TWICE DAILY   omeprazole (PRILOSEC) 20 MG capsule Take 20 mg by mouth daily.    triamcinolone cream (KENALOG) 0.1 % Apply 1 application. topically 2 (two) times daily as needed (itching). Mixed with nystatin   Zinc Sulfate (ZINC 15 PO) Take 30 mg by mouth daily.   [DISCONTINUED] alendronate (FOSAMAX) 70 MG tablet Take 70 mg by mouth every Sunday. Take with a full glass of water on an empty stomach.     Allergies:   Patient has no known allergies.   Past Medical History:  Diagnosis Date   GERD (gastroesophageal reflux disease)    Hyperlipidemia    Hypertension    Perianal itch 01/30/2015   Post-menopausal atrophic vaginitis 09/28/2012   Vulvar itching 01/30/2015   Yeast infection 09/28/2012    Past Surgical History:  Procedure Laterality Date   BUNIONECTOMY     CARPAL TUNNEL RELEASE Left 02/2014   CESAREAN SECTION     COLONOSCOPY N/A 08/01/2014   Procedure: COLONOSCOPY;  Surgeon: Malissa Hippo, MD;  Location: [facility];  Service: Endoscopy;  Laterality: N/A;  930   COLONOSCOPY WITH PROPOFOL N/A 08/28/2021   Procedure: COLONOSCOPY WITH PROPOFOL;  Surgeon: Malissa Hippo, MD;  Location: [facility];  Service: Endoscopy;  Laterality: N/A;   CYSTOSTOMY W/ BLADDER BIOPSY     DIAGNOSTIC LAPAROSCOPY       Social History:  The patient  reports that she has quit smoking. Her smoking use included cigarettes. She has never used smokeless tobacco. She reports that she does not drink alcohol and does not use drugs.   Family History:  The patient's family history includes COPD in her mother; Cancer in her father; Diabetes in her father and sister; Heart disease in her father.    ROS:  Please see the history of present illness. All other systems are reviewed and  Negative to the above problem except as noted.    PHYSICAL EXAM: VS:  BP (!) 152/80   Pulse 76   Ht 5\' 1"  (1.549 m)   Wt  153 lb (69.4 kg)   SpO2 96%   BMI 28.91 kg/m   GEN: Overweight 72 yo  in no acute distress  HEENT: normal  Neck: no JVD, carotid bruits, Cardiac: RRR; no murmurs  No LE edema  Respiratory:  clear to auscultation  GI: soft, nontender   No hepatomegaly  MS: no deformity Moving all extremities   Skin: warm and dry, no rash Neuro:  Strength and sensation are intact Psych: euthymic mood, full affect   EKG:  EKG is not  ordered today.   Lipid Panel No results found for: "CHOL", "TRIG", "HDL", "CHOLHDL", "VLDL", "LDLCALC", "LDLDIRECT"    Wt Readings from Last 3 Encounters:  12/01/22 153 lb (69.4 kg)  08/28/21 150 lb (68 kg)  04/24/21 152 lb (68.9 kg)      ASSESSMENT AND PLAN:  1 Chest pain   Pt with atherosclerosis, CAD on CT years ago   Now with intermittent CP    Also with some dyspnea I would recomm a CCTA to define       2  Irreg heart beat    Pt with intermitt irreg HB    Will set up for Zio patch to evaluate for arrhythmia  3  Lipids   Pt is on atorvastatin 10 mg   LDL 97 in 2023  With CAD needs tighter control       Will get lipomed, ApoB, Lpa in 8 wks    4  HTN  BP is not optimal  FOllow for now   Told her to keep track at home      5  MEtabolics  Reviewed diet     Minimize carbs       Current medicines are reviewed at length with the patient today.  The patient does not have concerns regarding medicines.  Signed, Dietrich Pates, MD  12/01/2022 10:19 AM    [facility] [address] [place], [place], [place]  [address] Phone: [phone]; Fax: [phone]

## 2022-12-04 ENCOUNTER — Telehealth: Payer: Self-pay | Admitting: Internal Medicine

## 2022-12-04 DIAGNOSIS — R0789 Other chest pain: Secondary | ICD-10-CM

## 2022-12-04 DIAGNOSIS — R079 Chest pain, unspecified: Secondary | ICD-10-CM | POA: Diagnosis not present

## 2022-12-04 NOTE — Telephone Encounter (Signed)
Confirmed that patient will wear monitor for 28 days total,she has received one monitor so far and will wear it now and await appointment for coronary ct

## 2022-12-04 NOTE — Telephone Encounter (Signed)
Patient called to speak with Dr. Tenny Craw or nurse in regards to her heart monitor. Says she has questions to ask about it

## 2022-12-08 DIAGNOSIS — Z79899 Other long term (current) drug therapy: Secondary | ICD-10-CM | POA: Diagnosis not present

## 2022-12-09 LAB — BASIC METABOLIC PANEL WITH GFR
BUN/Creatinine Ratio: 15 (ref 12–28)
BUN: 11 mg/dL (ref 8–27)
CO2: 23 mmol/L (ref 20–29)
Calcium: 9.8 mg/dL (ref 8.7–10.3)
Chloride: 100 mmol/L (ref 96–106)
Creatinine, Ser: 0.72 mg/dL (ref 0.57–1.00)
Glucose: 132 mg/dL — ABNORMAL HIGH (ref 70–99)
Potassium: 4.1 mmol/L (ref 3.5–5.2)
Sodium: 140 mmol/L (ref 134–144)
eGFR: 88 mL/min/{1.73_m2}

## 2022-12-18 ENCOUNTER — Encounter (HOSPITAL_COMMUNITY): Payer: Self-pay

## 2022-12-22 ENCOUNTER — Ambulatory Visit (HOSPITAL_COMMUNITY)
Admission: RE | Admit: 2022-12-22 | Discharge: 2022-12-22 | Disposition: A | Discharge status: Home / Self Care | Payer: Medicare HMO | Source: Ambulatory Visit | Attending: Internal Medicine | Admitting: Internal Medicine

## 2022-12-22 DIAGNOSIS — R079 Chest pain, unspecified: Secondary | ICD-10-CM | POA: Insufficient documentation

## 2022-12-22 DIAGNOSIS — I2510 Atherosclerotic heart disease of native coronary artery without angina pectoris: Secondary | ICD-10-CM | POA: Insufficient documentation

## 2022-12-22 MED ORDER — NITROGLYCERIN 0.4 MG SL SUBL
SUBLINGUAL_TABLET | SUBLINGUAL | Status: AC
  Filled 2022-12-22: qty 2

## 2022-12-22 MED ORDER — NITROGLYCERIN 0.4 MG SL SUBL
0.8000 mg | SUBLINGUAL_TABLET | Freq: Once | SUBLINGUAL | Status: AC
  Administered 2022-12-22: 0.8 mg via SUBLINGUAL

## 2022-12-22 MED ORDER — IOHEXOL 350 MG/ML SOLN
95.0000 mL | Freq: Once | INTRAVENOUS | Status: AC | PRN
  Administered 2022-12-22: 95 mL via INTRAVENOUS

## 2022-12-24 ENCOUNTER — Telehealth: Payer: Self-pay

## 2022-12-24 DIAGNOSIS — R079 Chest pain, unspecified: Secondary | ICD-10-CM | POA: Diagnosis not present

## 2022-12-24 DIAGNOSIS — R0789 Other chest pain: Secondary | ICD-10-CM | POA: Diagnosis not present

## 2022-12-24 DIAGNOSIS — Z79899 Other long term (current) drug therapy: Secondary | ICD-10-CM

## 2022-12-24 MED ORDER — ROSUVASTATIN CALCIUM 20 MG PO TABS: 20.0000 mg | ORAL_TABLET | Freq: Every day | ORAL | 3 refills | Status: DC

## 2022-12-24 NOTE — Telephone Encounter (Signed)
Results discussed with patient,will switch to crestor,e-scribed to local pharmacy ,mailed lab slip

## 2022-12-24 NOTE — Telephone Encounter (Signed)
-----   Message from Nurse Dewayne Hatch B sent at 12/24/2022  7:53 AM EDT -----  ----- Message ----- From: Pricilla Riffle, MD Sent: 12/23/2022   8:25 PM EDT To: Bertram Millard, RN  CT scan shows mild to moderate CAD   Nothing appears flow limiting, should not be causing symptoms  She should have tighter control of LDL     I would recomm switching to Crestor 20 mg     Follow up lipomed, Lpa  and Apo B in 8 to 12 wks with liver panel

## 2022-12-25 ENCOUNTER — Telehealth: Payer: Self-pay | Admitting: Internal Medicine

## 2022-12-25 NOTE — Telephone Encounter (Signed)
Patient is requesting call back to discuss her heart monitor. She states she sent one off already before CT test, but was suppose to reapply another after CT, but has not received. Requesting call back to discuss further.

## 2022-12-25 NOTE — Telephone Encounter (Signed)
I spoke with patient just yesterday about this. She will need to await the delivery of second monitor. I have already messaged the [employer] rep regarding this.

## 2022-12-25 NOTE — Telephone Encounter (Signed)
[employer] has sent overnight monitor to pt

## 2022-12-25 NOTE — Telephone Encounter (Signed)
Patient is aware 

## 2023-01-08 DIAGNOSIS — Z23 Encounter for immunization: Secondary | ICD-10-CM | POA: Diagnosis not present

## 2023-01-22 DIAGNOSIS — E1129 Type 2 diabetes mellitus with other diabetic kidney complication: Secondary | ICD-10-CM | POA: Diagnosis not present

## 2023-02-10 DIAGNOSIS — X32XXXD Exposure to sunlight, subsequent encounter: Secondary | ICD-10-CM | POA: Diagnosis not present

## 2023-02-10 DIAGNOSIS — L570 Actinic keratosis: Secondary | ICD-10-CM | POA: Diagnosis not present

## 2023-02-10 DIAGNOSIS — B078 Other viral warts: Secondary | ICD-10-CM | POA: Diagnosis not present

## 2023-02-18 ENCOUNTER — Telehealth: Payer: Self-pay | Admitting: Internal Medicine

## 2023-02-18 NOTE — Telephone Encounter (Signed)
Pt is requesting a callback regarding her monitor results since she mailed it back on 11/4. Please advise

## 2023-02-18 NOTE — Telephone Encounter (Signed)
Requesting last 2 weeks result of heart monitor that she wore. Says she mailed it on 01/18/2023. Final 2 weeks readings are scanned into chart.

## 2023-02-18 NOTE — Telephone Encounter (Signed)
*  STAT* If patient is at the pharmacy, call can be transferred to refill team.   1. Which medications need to be refilled? (please list name of each medication and dose if known) rosuvastatin (CRESTOR) 20 MG tablet   2. Which pharmacy/location (including street and city if local pharmacy) is medication to be sent to?  [facility] - [place], [place] - [address] [place]      3. Do they need a 30 day or 90 day supply? 90 day

## 2023-02-19 ENCOUNTER — Other Ambulatory Visit: Payer: Self-pay

## 2023-02-19 MED ORDER — ROSUVASTATIN CALCIUM 20 MG PO TABS: 20.0000 mg | ORAL_TABLET | Freq: Every day | ORAL | 2 refills | Status: DC

## 2023-02-19 NOTE — Telephone Encounter (Signed)
 RX sent to requested Pharmacy

## 2023-02-20 NOTE — Telephone Encounter (Signed)
I just see one monitor report   Sinus rhythm with occsaional PACs, PVCs  NO significant arrhythmia.    This id in CV section of chart

## 2023-02-22 NOTE — Telephone Encounter (Signed)
Per DPR, results left on home machine.

## 2023-03-04 ENCOUNTER — Other Ambulatory Visit (HOSPITAL_COMMUNITY)
Admission: RE | Admit: 2023-03-04 | Discharge: 2023-03-04 | Disposition: A | Discharge status: Home / Self Care | Payer: Medicare HMO | Source: Ambulatory Visit | Attending: Internal Medicine | Admitting: Internal Medicine

## 2023-03-04 DIAGNOSIS — Z13220 Encounter for screening for lipoid disorders: Secondary | ICD-10-CM | POA: Diagnosis not present

## 2023-03-04 DIAGNOSIS — R079 Chest pain, unspecified: Secondary | ICD-10-CM | POA: Diagnosis not present

## 2023-03-04 DIAGNOSIS — I700 Atherosclerosis of aorta: Secondary | ICD-10-CM | POA: Diagnosis not present

## 2023-03-04 DIAGNOSIS — Z79899 Other long term (current) drug therapy: Secondary | ICD-10-CM | POA: Insufficient documentation

## 2023-03-04 DIAGNOSIS — I2510 Atherosclerotic heart disease of native coronary artery without angina pectoris: Secondary | ICD-10-CM | POA: Insufficient documentation

## 2023-03-05 LAB — MISC LABCORP TEST (SEND OUT)
Labcorp test code: 167015
Labcorp test code: 884247

## 2023-03-09 LAB — LIPOPROTEIN A (LPA): Lipoprotein (a): 22.7 nmol/L (ref <75.0)

## 2023-03-15 ENCOUNTER — Other Ambulatory Visit: Payer: Self-pay | Admitting: Adult Health

## 2023-03-18 ENCOUNTER — Telehealth: Payer: Self-pay | Admitting: Internal Medicine

## 2023-03-18 DIAGNOSIS — Z79899 Other long term (current) drug therapy: Secondary | ICD-10-CM

## 2023-03-18 DIAGNOSIS — Z13220 Encounter for screening for lipoid disorders: Secondary | ICD-10-CM

## 2023-03-18 NOTE — Telephone Encounter (Signed)
 Per Dr. Okey results note:  LDL is still too high given CAD   I would recomm increasing liptitor to 20 mg Follow up lipomed in 8 wks  Pt notified and verbalized understanding.   Patient is currently taking Rosuvastatin  20 mg once daily.   Pt would like to know when she is due back for f/u visit   Please advise

## 2023-03-18 NOTE — Telephone Encounter (Signed)
 Pt calling to speak with a nurse. Please advise

## 2023-03-19 NOTE — Telephone Encounter (Signed)
 I would recomm adding Zetia 10 mg   Keep on Crestor 20 mg  Follow up lipomed and liver panel in 12 wks   I saw her in the fall   Set up appt in clinic in March

## 2023-03-22 MED ORDER — EZETIMIBE 10 MG PO TABS: 10.0000 mg | ORAL_TABLET | Freq: Every day | ORAL | 3 refills | Status: DC

## 2023-03-22 NOTE — Telephone Encounter (Signed)
 Spoke with pt and informed of Dr. Charlott Rakes response. Pt agrees to start Zetia and have labs done in 3 months.

## 2023-03-24 DIAGNOSIS — D225 Melanocytic nevi of trunk: Secondary | ICD-10-CM | POA: Diagnosis not present

## 2023-03-24 DIAGNOSIS — Z1283 Encounter for screening for malignant neoplasm of skin: Secondary | ICD-10-CM | POA: Diagnosis not present

## 2023-03-24 DIAGNOSIS — L820 Inflamed seborrheic keratosis: Secondary | ICD-10-CM | POA: Diagnosis not present

## 2023-03-24 DIAGNOSIS — B078 Other viral warts: Secondary | ICD-10-CM | POA: Diagnosis not present

## 2023-03-29 ENCOUNTER — Other Ambulatory Visit (HOSPITAL_COMMUNITY): Payer: Self-pay | Admitting: Internal Medicine

## 2023-03-29 DIAGNOSIS — Z1231 Encounter for screening mammogram for malignant neoplasm of breast: Secondary | ICD-10-CM

## 2023-04-12 DIAGNOSIS — H524 Presbyopia: Secondary | ICD-10-CM | POA: Diagnosis not present

## 2023-04-12 DIAGNOSIS — H52223 Regular astigmatism, bilateral: Secondary | ICD-10-CM | POA: Diagnosis not present

## 2023-04-15 ENCOUNTER — Telehealth: Payer: Self-pay | Admitting: Internal Medicine

## 2023-04-15 NOTE — Telephone Encounter (Signed)
Pt requesting a cb to discuss labs and new medication she was started on

## 2023-04-15 NOTE — Telephone Encounter (Signed)
Pt called to clarify next appt and when she needs to have labs done. All questions answered

## 2023-04-16 ENCOUNTER — Encounter (HOSPITAL_COMMUNITY): Payer: Self-pay

## 2023-04-16 ENCOUNTER — Ambulatory Visit (HOSPITAL_COMMUNITY)
Admission: RE | Admit: 2023-04-16 | Discharge: 2023-04-16 | Disposition: A | Discharge status: Home / Self Care | Payer: Medicare HMO | Source: Ambulatory Visit | Attending: Internal Medicine | Admitting: Internal Medicine

## 2023-04-16 DIAGNOSIS — Z1231 Encounter for screening mammogram for malignant neoplasm of breast: Secondary | ICD-10-CM | POA: Diagnosis not present

## 2023-04-26 DIAGNOSIS — M2041 Other hammer toe(s) (acquired), right foot: Secondary | ICD-10-CM | POA: Diagnosis not present

## 2023-04-26 DIAGNOSIS — M79674 Pain in right toe(s): Secondary | ICD-10-CM | POA: Diagnosis not present

## 2023-04-26 DIAGNOSIS — L851 Acquired keratosis [keratoderma] palmaris et plantaris: Secondary | ICD-10-CM | POA: Diagnosis not present

## 2023-04-26 DIAGNOSIS — M2011 Hallux valgus (acquired), right foot: Secondary | ICD-10-CM | POA: Diagnosis not present

## 2023-05-07 DIAGNOSIS — E1129 Type 2 diabetes mellitus with other diabetic kidney complication: Secondary | ICD-10-CM | POA: Diagnosis not present

## 2023-06-08 ENCOUNTER — Other Ambulatory Visit (HOSPITAL_COMMUNITY)
Admission: RE | Admit: 2023-06-08 | Discharge: 2023-06-08 | Disposition: A | Discharge status: Home / Self Care | Source: Ambulatory Visit | Attending: Internal Medicine | Admitting: Internal Medicine

## 2023-06-08 DIAGNOSIS — Z79899 Other long term (current) drug therapy: Secondary | ICD-10-CM | POA: Diagnosis present

## 2023-06-08 DIAGNOSIS — Z13220 Encounter for screening for lipoid disorders: Secondary | ICD-10-CM | POA: Insufficient documentation

## 2023-06-08 LAB — HEPATIC FUNCTION PANEL
ALT: 28 U/L (ref 0–44)
AST: 32 U/L (ref 15–41)
Albumin: 3.9 g/dL (ref 3.5–5.0)
Alkaline Phosphatase: 52 U/L (ref 38–126)
Bilirubin, Direct: 0.1 mg/dL (ref 0.0–0.2)
Indirect Bilirubin: 0.6 mg/dL (ref 0.3–0.9)
Total Bilirubin: 0.7 mg/dL (ref 0.0–1.2)
Total Protein: 7.6 g/dL (ref 6.5–8.1)

## 2023-06-09 ENCOUNTER — Ambulatory Visit: Payer: Medicare HMO | Admitting: Student

## 2023-06-09 LAB — NMR, LIPOPROFILE
Cholesterol, Total: 135 mg/dL (ref 100–199)
HDL Cholesterol by NMR: 61 mg/dL (ref >39)
HDL Particle Number: 36.6 umol/L (ref >=30.5)
LDL Particle Number: 635 nmol/L (ref <1000)
LDL Size: 21.4 nm (ref >20.5)
LDL-C (NIH Calc): 55 mg/dL (ref 0–99)
LP-IR Score: 33 (ref <=45)
Small LDL Particle Number: 264 nmol/L (ref <=527)
Triglycerides by NMR: 103 mg/dL (ref 0–149)

## 2023-06-14 NOTE — Progress Notes (Unsigned)
 Cardiology Office Note    Date:  06/15/2023  ID:  Danae, Oland 04-28-1950, MRN [id number] Cardiologist: Dietrich Pates, MD    History of Present Illness:    Maria Werner is a 73 y.o. female with past medical history of palpitations, HTN, HLD and Type 2 DM who presents to the office today for 3-month follow-up.   She was last examined by Dr. Tenny Craw in 11/2022 as a new patient referral after having an episode of chest discomfort and palpitations. Given coronary calcifications by prior CT, a Coronary CTA and Zio patch were recommended for further assessment.   Zio patch showed predominately NSR with rare PAC's and occasional PVC's with a 2.3% burden. Her Coronary CTA showed a coronary calcium score of 144 and total plaque volume of 120 mm3 with mild plaque with 25-49% proximal LAD stenosis. Her statin was switched to Crestor 20mg  daily but LDL remained above goal and she was also started on Zetia. Follow-up FLP in 05/2023 showed her LDL was down to 55.   In talking with the patient and her husband today, she reports overall doing well since her last office visit. She enjoys staying active around her home and has recently been push mowing her yard. Also helps to take care of her 3 grandchildren. She denies any recurrent chest pain or dyspnea on exertion. No recent orthopnea, PND, pitting edema or palpitations. Reports her palpitations improved with caffeine reduction.  Studies Reviewed:   EKG: EKG is ordered today and demonstrates:   EKG Interpretation Date/Time:  Tuesday June 15 2023 13:01:31 EDT Ventricular Rate:  74 PR Interval:  196 QRS Duration:  84 QT Interval:  418 QTC Calculation: 463 R Axis:   71  Text Interpretation: Normal sinus rhythm Normal ECG No acute changes Confirmed by Randall An ([id number]) on 06/15/2023 1:05:23 PM       Coronary CTA: 12/2022 IMPRESSION: 1. Coronary calcium score of 144. This was 71st percentile for age-, sex, and race-matched  controls.   2. Total plaque volume 120 mm3 which is 34th percentile for age- and sex-matched controls (calcified plaque 48mm3; non-calcified plaque 63mm3). TPV is moderate.   3. Normal coronary origin with right dominance.   4. Mild atherosclerosis. 25-49% proximal LAD. Study limited for detection of soft plaque due to slab artifact.   5. Recommend preventive therapy and risk factor modification.   Zio Patch: 12/2022 Patch Wear Time:  13 days and 11 hours (2024-09-20T20:58:13-0400 to 2024-10-04T08:23:53-0400)   Predominant rhythm:  Sinus rhythm  Rate 54 to 123 bpm   Average HR 77 bpm    Rare PACs   Occasional PVCs (2.3% total). 2 episodes SVT, longest/fastest for 8 beats at max rate of 162 bpm   Symptoms correlated with SR, SR with PACs, SR with PVCs     No significant arrhythmias   Physical Exam:   VS:  BP 126/70 (BP Location: Right Arm, Cuff Size: Normal)   Pulse 70   Ht 5\' 2"  (1.575 m)   Wt 157 lb 6.4 oz (71.4 kg)   SpO2 96%   BMI 28.79 kg/m    Wt Readings from Last 3 Encounters:  06/15/23 157 lb 6.4 oz (71.4 kg)  12/01/22 153 lb (69.4 kg)  08/28/21 150 lb (68 kg)     GEN: Well nourished, well developed female appearing in no acute distress NECK: No JVD; No carotid bruits CARDIAC: RRR, no murmurs, rubs, gallops RESPIRATORY:  Clear to auscultation without rales, wheezing or rhonchi  ABDOMEN: Appears non-distended.  No obvious abdominal masses. EXTREMITIES: No clubbing or cyanosis. No pitting edema.  Distal pedal pulses are 2+ bilaterally.   Assessment and Plan:   1. CAD - Prior Coronary CTA in 12/2022 showed mild 20 to 49% plaque along the proximal LAD and risk factor modification was recommended. She denies any recent anginal symptoms. Continue current medical therapy with Zetia 10 mg daily and Crestor 20 mg daily.  2. HLD - Her LDL had improved to 55 when checked last month. Continue Zetia 10 mg daily and Crestor 20 mg daily.  3. HTN - Blood pressure is  well-controlled at 126/70 during today's visit. Continue current medical therapy with HCTZ 25 mg daily.  4. Palpitations - Prior monitor in 12/2022 showed predominantly normal sinus rhythm with rare PAC's and PVC's as outlined above and only 2 episodes of SVT with the longest lasting for 8 beats.  Signed, Ellsworth Lennox, PA-C

## 2023-06-15 ENCOUNTER — Ambulatory Visit: Payer: Medicare HMO | Attending: Student | Admitting: Student

## 2023-06-15 ENCOUNTER — Encounter: Payer: Self-pay | Admitting: Student

## 2023-06-15 VITALS — BP 126/70 | HR 70 | Ht 62.0 in | Wt 157.4 lb

## 2023-06-15 DIAGNOSIS — I10 Essential (primary) hypertension: Secondary | ICD-10-CM | POA: Diagnosis not present

## 2023-06-15 DIAGNOSIS — E785 Hyperlipidemia, unspecified: Secondary | ICD-10-CM

## 2023-06-15 DIAGNOSIS — R002 Palpitations: Secondary | ICD-10-CM | POA: Diagnosis not present

## 2023-06-15 DIAGNOSIS — I2510 Atherosclerotic heart disease of native coronary artery without angina pectoris: Secondary | ICD-10-CM | POA: Diagnosis not present

## 2023-06-15 MED ORDER — ROSUVASTATIN CALCIUM 20 MG PO TABS: 20.0000 mg | ORAL_TABLET | Freq: Every day | ORAL | 3 refills | Status: AC

## 2023-06-15 NOTE — Patient Instructions (Signed)
 Medication Instructions:   Continue current medications.   *If you need a refill on your cardiac medications before your next appointment, please call your pharmacy*  Follow-Up: At [facility], you and your health needs are our priority.  As part of our continuing mission to provide you with exceptional heart care, our providers are all part of one team.  This team includes your primary Cardiologist (physician) and Advanced Practice Providers or APPs (Physician Assistants and Nurse Practitioners) who all work together to provide you with the care you need, when you need it.  Your next appointment:   1 year(s)  Provider:   You may see Dietrich Pates, MD or one of the following Advanced Practice Providers on your designated Care Team:   Randall An, PA-C  [place], [place] Jacolyn Reedy, [place]     We recommend signing up for the patient portal called "MyChart".  Sign up information is provided on this After Visit Summary.  MyChart is used to connect with patients for Virtual Visits (Telemedicine).  Patients are able to view lab/test results, encounter notes, upcoming appointments, etc.  Non-urgent messages can be sent to your provider as well.   To learn more about what you can do with MyChart, go to [email or web address].

## 2023-08-18 ENCOUNTER — Other Ambulatory Visit: Payer: Self-pay | Admitting: Adult Health

## 2023-08-20 DIAGNOSIS — M542 Cervicalgia: Secondary | ICD-10-CM | POA: Diagnosis not present

## 2023-08-25 DIAGNOSIS — E1129 Type 2 diabetes mellitus with other diabetic kidney complication: Secondary | ICD-10-CM | POA: Diagnosis not present

## 2023-08-28 NOTE — Therapy (Signed)
 OUTPATIENT PHYSICAL THERAPY CERVICAL EVALUATION   Patient Name: Maria Werner MRN: [id number] DOB:Dec 07, 1950, 73 y.o., female Today's Date: 08/30/2023  END OF SESSION:  PT End of Session - 08/30/23 0719     Visit Number 1    Number of Visits 4    Date for PT Re-Evaluation 09/27/23    Authorization Type [employer]    Progress Note Due on Visit 10    PT Start Time 0716    PT Stop Time 0756    PT Time Calculation (min) 40 min    Activity Tolerance Patient tolerated treatment well    Behavior During Therapy [facility] for tasks assessed/performed          Past Medical History:  Diagnosis Date   GERD (gastroesophageal reflux disease)    Hyperlipidemia    Hypertension    Perianal itch 01/30/2015   Post-menopausal atrophic vaginitis 09/28/2012   Vulvar itching 01/30/2015   Yeast infection 09/28/2012   Past Surgical History:  Procedure Laterality Date   BUNIONECTOMY     CARPAL TUNNEL RELEASE Left 02/2014   CESAREAN SECTION     COLONOSCOPY N/A 08/01/2014   Procedure: COLONOSCOPY;  Surgeon: Ruby Corporal, MD;  Location: [facility];  Service: Endoscopy;  Laterality: N/A;  930   COLONOSCOPY WITH PROPOFOL  N/A 08/28/2021   Procedure: COLONOSCOPY WITH PROPOFOL ;  Surgeon: Ruby Corporal, MD;  Location: [facility];  Service: Endoscopy;  Laterality: N/A;   CYSTOSTOMY W/ BLADDER BIOPSY     DIAGNOSTIC LAPAROSCOPY     Patient Active Problem List   Diagnosis Date Noted   Encounter for well woman exam with routine gynecological exam 04/24/2021   Encounter for screening fecal occult blood testing 04/24/2021   Encounter for gynecological examination with Papanicolaou smear of cervix 02/02/2018   Screening for colorectal cancer 02/02/2018   Vulvar itching 01/30/2015   Yeast infection 09/28/2012   Post-menopausal atrophic vaginitis 09/28/2012    PCP: Artemisa Bile, MD  REFERRING PROVIDER: Lucie Ruts, PA-C; ( with Daphney Eans)  REFERRING DIAG: cervico-thoracic pain  (needs dry needling)  THERAPY DIAG:  Neck pain - Plan: PT plan of care cert/re-cert  Pain in thoracic spine - Plan: PT plan of care cert/re-cert  Rationale for Evaluation and Treatment: Rehabilitation  ONSET DATE: 3 weeks ago  SUBJECTIVE:                                                                                                                                                                                                         SUBJECTIVE STATEMENT: 3  weeks ago today; no known onset.  [place] walking and playing [place] hole.  Woke up with pain that got worse.  Called PCP who did muscle relaxer and pain med and that did not help.  Saw PA at Dr. Abigail Abler and they gave her an 2 injections one was a pain injection and a steroid shot and both did not help; did an xray of neck and but not of thoracic spine; worse at night.  She helps take care of her grandchildren Hand dominance: Right  PERTINENT HISTORY:  History of osteoporosis  PAIN:  Are you having pain? Yes: NPRS scale: 5/10 Pain location: right side follows the rib line Pain description: deep pain Aggravating factors: as the day progresses; worse when lying down Relieving factors: ice   PRECAUTIONS: None   WEIGHT BEARING RESTRICTIONS: No  FALLS:  Has patient fallen in last 6 months? No  OCCUPATION: retired  PLOF: Independent  PATIENT GOALS: less pain  NEXT MD VISIT: sees Hildy Lowers 6/18  OBJECTIVE:  Note: Objective measures were completed at Evaluation unless otherwise noted.  DIAGNOSTIC FINDINGS:  Xray of cervical spine   PATIENT SURVEYS:  NDI: 17/50 34%  Minimum Detectable Change (90% confidence): 5 points or 10% points  COGNITION: Overall cognitive status: Within functional limits for tasks assessed  SENSATION: Some numbness at night  POSTURE: rounded shoulders and forward head  PALPATION: Tender thoracic spine vertebral process; right winging scapula   CERVICAL  ROM:   Active ROM AROM (deg) eval  Flexion full  Extension   Right lateral flexion 26  Left lateral flexion 28  Right rotation 56  Left rotation 58   (Blank rows = not tested)  UPPER EXTREMITY ROM:  Active ROM Right eval Left eval  Shoulder flexion    Shoulder extension    Shoulder abduction    Shoulder adduction    Shoulder extension    Shoulder internal rotation    Shoulder external rotation    Elbow flexion    Elbow extension    Wrist flexion    Wrist extension    Wrist ulnar deviation    Wrist radial deviation    Wrist pronation    Wrist supination     (Blank rows = not tested)  UPPER EXTREMITY MMT:  MMT Right eval Left eval  Shoulder flexion 4-* 5  Shoulder extension    Shoulder abduction    Shoulder adduction    Shoulder extension    Shoulder internal rotation    Shoulder external rotation    Middle trapezius    Lower trapezius    Elbow flexion    Elbow extension    Wrist flexion    Wrist extension    Wrist ulnar deviation    Wrist radial deviation    Wrist pronation    Wrist supination    Grip strength     (Blank rows = not tested)  FUNCTIONAL TESTS:  5 times sit to stand: next visit  TREATMENT DATE: 08/30/23 physical therapy evaluation and HEP instruction ; postural correction  Trigger Point Dry Needling  Initial Treatment: Pt instructed on Dry Needling rational, procedures, and possible side effects. Pt instructed to expect mild to moderate muscle soreness later in the day and/or into the next day.  Pt instructed in methods to reduce muscle soreness. Pt instructed to continue prescribed HEP. Because Dry Needling was performed over or adjacent to a lung field, pt was educated on S/S of pneumothorax and to  seek immediate medical attention should they occur.  Patient was educated on signs and symptoms of infection and other risk factors and advised to seek medical attention should they occur.  Patient verbalized understanding of these  instructions and education.   Patient Verbal Consent Given: Yes Education Handout Provided: Yes Muscles Treated: right rhomboid, right thoracic paraspinals Electrical Stimulation Performed: No Treatment Response/Outcome: mild aching                                                                                                                                PATIENT EDUCATION:  Education details: Patient educated on exam findings, POC, scope of PT, HEP, and what to expect next visit. Person educated: Patient Education method: Explanation, Demonstration, and Handouts Education comprehension: verbalized understanding, returned demonstration, verbal cues required, and tactile cues required HOME EXERCISE PROGRAM: Access Code: [email or web address] URL: [email or web address] Date: 08/30/2023 Prepared by: [facility]  Exercises - Seated Gentle Upper Trapezius Stretch  - 2 x daily - 7 x weekly - 1 sets - 5 reps - 20 sec hold  ASSESSMENT:  CLINICAL IMPRESSION: Patient is a 73 y.o. female who was seen today for physical therapy evaluation and treatment for cervico-thoracic pain (needs dry needling). Patient demonstrates decreased strength, ROM restriction, reduced flexibility, increased tenderness to palpation and postural abnormalities which are likely contributing to symptoms of pain and are negatively impacting patient ability to perform ADLs. Patient will benefit from skilled physical therapy services to address these deficits to reduce pain and improve level of function with ADLs   OBJECTIVE IMPAIRMENTS: decreased activity tolerance, decreased mobility, decreased ROM, decreased strength, increased fascial restrictions, impaired perceived functional ability, and pain.   ACTIVITY LIMITATIONS: carrying, lifting, bending, sitting, standing, sleeping, bed mobility, reach over head, and caring for others  PARTICIPATION LIMITATIONS: meal prep, cleaning, laundry, shopping, community activity,  and yard work  PERSONAL FACTORS: 1 comorbidity: osteoporosis are also affecting patient's functional outcome.   REHAB POTENTIAL: Good  CLINICAL DECISION MAKING: Evolving/moderate complexity  EVALUATION COMPLEXITY: Moderate   GOALS: Goals reviewed with patient? No  SHORT TERM GOALS: Target date: 09/13/2023  patient will be independent with initial HEP  Baseline:  Goal status: INITIAL  2.  Patient will report 50% improvement overall  Baseline:  Goal status: INITIAL   LONG TERM GOALS: Target date: 09/27/2023  Patient will be independent in self management strategies to improve quality of life and functional outcomes.  Baseline:  Goal status: INITIAL  2.  Patient will report 75% improvement overall  Baseline:  Goal status: INITIAL  3.  Patient will increase her cervical rotation by 20 degrees to improve ability to scan for safety. Baseline: see above Goal status: INITIAL  4.  Patient will be able to sleep in her bed a full nights sleep without pain waking her Baseline:  Goal status: INITIAL  5.  Patient will improve NDI score by 7 points to demonstrate improve percieved functional mobility  Baseline:  17/50  Goals status: INITIAL  PLAN:  PT FREQUENCY: 1x/week  PT DURATION: 4 weeks  PLANNED INTERVENTIONS: 97164- PT Re-evaluation, 97110-Therapeutic exercises, 97530- Therapeutic activity, 97112- Neuromuscular re-education, 97535- Self Care, 57322- Manual therapy, [phone]- Gait training, [phone]- Orthotic Fit/training, [phone]- Canalith repositioning, [id number]- Aquatic Therapy, [phone]- Splinting, [phone]- Wound care (first 20 sq cm), 97598- Wound care (each additional 20 sq cm)Patient/Family education, Balance training, Stair training, Taping, Dry Needling, Joint mobilization, Joint manipulation, Spinal manipulation, Spinal mobilization, Scar mobilization, and DME instructions.   PLAN FOR NEXT SESSION: Review HEP and goals; assess reaction to dry needling; postural  strengthening; thoracic mobility; question possible thoracic stress fracture??   9:34 AM, 08/30/23 Sourish Allender Small Piero Mustard MPT [facility] [place] [phone]

## 2023-08-30 ENCOUNTER — Other Ambulatory Visit: Payer: Self-pay

## 2023-08-30 ENCOUNTER — Ambulatory Visit (HOSPITAL_COMMUNITY)
Admission: RE | Admit: 2023-08-30 | Discharge: 2023-08-30 | Disposition: A | Discharge status: Home / Self Care | Source: Ambulatory Visit | Attending: Internal Medicine | Admitting: Internal Medicine

## 2023-08-30 ENCOUNTER — Other Ambulatory Visit (HOSPITAL_COMMUNITY): Payer: Self-pay | Admitting: Internal Medicine

## 2023-08-30 ENCOUNTER — Ambulatory Visit (HOSPITAL_COMMUNITY): Attending: Physician Assistant

## 2023-08-30 DIAGNOSIS — M4854XA Collapsed vertebra, not elsewhere classified, thoracic region, initial encounter for fracture: Secondary | ICD-10-CM | POA: Diagnosis not present

## 2023-08-30 DIAGNOSIS — M47814 Spondylosis without myelopathy or radiculopathy, thoracic region: Secondary | ICD-10-CM | POA: Diagnosis not present

## 2023-08-30 DIAGNOSIS — M546 Pain in thoracic spine: Secondary | ICD-10-CM | POA: Insufficient documentation

## 2023-08-30 DIAGNOSIS — M542 Cervicalgia: Secondary | ICD-10-CM | POA: Diagnosis present

## 2023-08-30 DIAGNOSIS — M419 Scoliosis, unspecified: Secondary | ICD-10-CM | POA: Diagnosis not present

## 2023-08-30 NOTE — Patient Instructions (Signed)

## 2023-09-01 ENCOUNTER — Other Ambulatory Visit (HOSPITAL_COMMUNITY): Payer: Self-pay | Admitting: Internal Medicine

## 2023-09-01 DIAGNOSIS — M810 Age-related osteoporosis without current pathological fracture: Secondary | ICD-10-CM | POA: Diagnosis not present

## 2023-09-01 DIAGNOSIS — S22030G Wedge compression fracture of third thoracic vertebra, subsequent encounter for fracture with delayed healing: Secondary | ICD-10-CM

## 2023-09-01 DIAGNOSIS — S22000A Wedge compression fracture of unspecified thoracic vertebra, initial encounter for closed fracture: Secondary | ICD-10-CM | POA: Diagnosis not present

## 2023-09-01 DIAGNOSIS — R5383 Other fatigue: Secondary | ICD-10-CM | POA: Diagnosis not present

## 2023-09-01 DIAGNOSIS — S22050A Wedge compression fracture of T5-T6 vertebra, initial encounter for closed fracture: Secondary | ICD-10-CM | POA: Diagnosis not present

## 2023-09-01 DIAGNOSIS — E559 Vitamin D deficiency, unspecified: Secondary | ICD-10-CM | POA: Diagnosis not present

## 2023-09-02 DIAGNOSIS — S22050A Wedge compression fracture of T5-T6 vertebra, initial encounter for closed fracture: Secondary | ICD-10-CM | POA: Diagnosis not present

## 2023-09-02 DIAGNOSIS — Z6828 Body mass index (BMI) 28.0-28.9, adult: Secondary | ICD-10-CM | POA: Diagnosis not present

## 2023-09-06 DIAGNOSIS — M546 Pain in thoracic spine: Secondary | ICD-10-CM | POA: Diagnosis not present

## 2023-09-13 ENCOUNTER — Encounter (HOSPITAL_COMMUNITY): Payer: Self-pay

## 2023-09-13 ENCOUNTER — Ambulatory Visit (HOSPITAL_COMMUNITY)

## 2023-09-15 DIAGNOSIS — S22050D Wedge compression fracture of T5-T6 vertebra, subsequent encounter for fracture with routine healing: Secondary | ICD-10-CM | POA: Diagnosis not present

## 2023-09-15 DIAGNOSIS — M810 Age-related osteoporosis without current pathological fracture: Secondary | ICD-10-CM | POA: Diagnosis not present

## 2023-09-21 DIAGNOSIS — S22050D Wedge compression fracture of T5-T6 vertebra, subsequent encounter for fracture with routine healing: Secondary | ICD-10-CM | POA: Diagnosis not present

## 2023-09-21 DIAGNOSIS — S22050A Wedge compression fracture of T5-T6 vertebra, initial encounter for closed fracture: Secondary | ICD-10-CM | POA: Diagnosis not present

## 2023-09-23 ENCOUNTER — Encounter (HOSPITAL_COMMUNITY)

## 2023-09-29 ENCOUNTER — Encounter (HOSPITAL_COMMUNITY)

## 2023-10-06 DIAGNOSIS — M40294 Other kyphosis, thoracic region: Secondary | ICD-10-CM | POA: Diagnosis not present

## 2023-10-25 DIAGNOSIS — Z79899 Other long term (current) drug therapy: Secondary | ICD-10-CM | POA: Diagnosis not present

## 2023-10-25 DIAGNOSIS — K219 Gastro-esophageal reflux disease without esophagitis: Secondary | ICD-10-CM | POA: Diagnosis not present

## 2023-10-25 DIAGNOSIS — E785 Hyperlipidemia, unspecified: Secondary | ICD-10-CM | POA: Diagnosis not present

## 2023-10-25 DIAGNOSIS — I10 Essential (primary) hypertension: Secondary | ICD-10-CM | POA: Diagnosis not present

## 2023-10-25 DIAGNOSIS — M810 Age-related osteoporosis without current pathological fracture: Secondary | ICD-10-CM | POA: Diagnosis not present

## 2023-10-25 DIAGNOSIS — E1129 Type 2 diabetes mellitus with other diabetic kidney complication: Secondary | ICD-10-CM | POA: Diagnosis not present

## 2023-11-03 ENCOUNTER — Ambulatory Visit (HOSPITAL_COMMUNITY)
Admission: RE | Admit: 2023-11-03 | Discharge: 2023-11-03 | Disposition: A | Discharge status: Home / Self Care | Source: Ambulatory Visit | Attending: Internal Medicine | Admitting: Internal Medicine

## 2023-11-03 DIAGNOSIS — X58XXXD Exposure to other specified factors, subsequent encounter: Secondary | ICD-10-CM | POA: Diagnosis not present

## 2023-11-03 DIAGNOSIS — Z13820 Encounter for screening for osteoporosis: Secondary | ICD-10-CM | POA: Insufficient documentation

## 2023-11-03 DIAGNOSIS — S22030G Wedge compression fracture of third thoracic vertebra, subsequent encounter for fracture with delayed healing: Secondary | ICD-10-CM | POA: Insufficient documentation

## 2023-11-03 DIAGNOSIS — M8589 Other specified disorders of bone density and structure, multiple sites: Secondary | ICD-10-CM | POA: Insufficient documentation

## 2023-11-26 DIAGNOSIS — Z008 Encounter for other general examination: Secondary | ICD-10-CM | POA: Diagnosis not present

## 2023-12-02 DIAGNOSIS — M810 Age-related osteoporosis without current pathological fracture: Secondary | ICD-10-CM | POA: Diagnosis not present

## 2023-12-23 DIAGNOSIS — Z23 Encounter for immunization: Secondary | ICD-10-CM | POA: Diagnosis not present

## 2024-01-07 ENCOUNTER — Other Ambulatory Visit: Payer: Self-pay | Admitting: Sports Medicine

## 2024-01-07 ENCOUNTER — Other Ambulatory Visit (HOSPITAL_COMMUNITY): Payer: Self-pay | Admitting: Sports Medicine

## 2024-01-07 DIAGNOSIS — M810 Age-related osteoporosis without current pathological fracture: Secondary | ICD-10-CM | POA: Insufficient documentation

## 2024-01-24 ENCOUNTER — Telehealth (HOSPITAL_COMMUNITY): Payer: Self-pay

## 2024-01-24 NOTE — Telephone Encounter (Signed)
 Auth Submission: APPROVED Site of care: Site of care: [facility] Payer: [employer] Medicare Medication & CPT/J Code(s) submitted: Jubbonti (V4863) Diagnosis Code: M81.0 Route of submission (phone, fax, portal): fax Phone # Fax #[phone]  Auth type: Buy/Bill PB Units/visits requested: 60mg  q18months x 2 doses Reference number: [id number] Approval from: 01/24/24 to 01/23/25

## 2024-01-28 ENCOUNTER — Encounter: Attending: Sports Medicine | Admitting: *Deleted

## 2024-01-28 VITALS — BP 170/95 | HR 85 | Temp 97.9°F | Resp 16

## 2024-01-28 DIAGNOSIS — M810 Age-related osteoporosis without current pathological fracture: Secondary | ICD-10-CM

## 2024-01-28 MED ORDER — DENOSUMAB-BBDZ 60 MG/ML ~~LOC~~ SOSY
60.0000 mg | PREFILLED_SYRINGE | Freq: Once | SUBCUTANEOUS | Status: DC
  Filled 2024-01-28: qty 1

## 2024-01-28 MED ORDER — DENOSUMAB 60 MG/ML ~~LOC~~ SOSY
60.0000 mg | PREFILLED_SYRINGE | Freq: Once | SUBCUTANEOUS | Status: AC
  Administered 2024-01-28: 60 mg via SUBCUTANEOUS

## 2024-01-28 NOTE — Progress Notes (Signed)
 Diagnosis: Osteoporosis  Provider:  Orpha Stabs MD  Procedure: Injection  Prolia  (Denosumab ), Dose: 60 mg, Site: subcutaneous, Number of injections: 1  Injection Site(s): Right lower quad. abdomne  Post Care: Observation period completed  Discharge: Condition: Good, Destination: Home . AVS Provided  Performed by:  Baldwin Darice Helling, RN

## 2024-02-21 DIAGNOSIS — Z79899 Other long term (current) drug therapy: Secondary | ICD-10-CM | POA: Diagnosis not present

## 2024-02-21 DIAGNOSIS — M810 Age-related osteoporosis without current pathological fracture: Secondary | ICD-10-CM | POA: Diagnosis not present

## 2024-02-21 DIAGNOSIS — K219 Gastro-esophageal reflux disease without esophagitis: Secondary | ICD-10-CM | POA: Diagnosis not present

## 2024-02-21 DIAGNOSIS — E1129 Type 2 diabetes mellitus with other diabetic kidney complication: Secondary | ICD-10-CM | POA: Diagnosis not present

## 2024-02-21 DIAGNOSIS — E785 Hyperlipidemia, unspecified: Secondary | ICD-10-CM | POA: Diagnosis not present

## 2024-02-28 DIAGNOSIS — I10 Essential (primary) hypertension: Secondary | ICD-10-CM | POA: Diagnosis not present

## 2024-02-28 DIAGNOSIS — D7589 Other specified diseases of blood and blood-forming organs: Secondary | ICD-10-CM | POA: Diagnosis not present

## 2024-02-28 DIAGNOSIS — E1129 Type 2 diabetes mellitus with other diabetic kidney complication: Secondary | ICD-10-CM | POA: Diagnosis not present

## 2024-02-28 DIAGNOSIS — S22050A Wedge compression fracture of T5-T6 vertebra, initial encounter for closed fracture: Secondary | ICD-10-CM | POA: Diagnosis not present

## 2024-03-03 ENCOUNTER — Other Ambulatory Visit: Payer: Self-pay | Admitting: Internal Medicine

## 2024-03-29 ENCOUNTER — Encounter: Payer: Self-pay | Admitting: *Deleted

## 2024-03-29 NOTE — Progress Notes (Signed)
 Maria Werner                                          MRN: [id number]   03/29/2024   The [facility] Quality Team Specialist reviewed this patient medical record for the purposes of chart review for care gap closure. The following were reviewed: chart review for care gap closure-controlling blood pressure.    [facility] Quality Team

## 2024-04-04 ENCOUNTER — Other Ambulatory Visit (HOSPITAL_COMMUNITY): Payer: Self-pay | Admitting: Internal Medicine

## 2024-04-04 DIAGNOSIS — Z1231 Encounter for screening mammogram for malignant neoplasm of breast: Secondary | ICD-10-CM

## 2024-04-19 ENCOUNTER — Inpatient Hospital Stay (HOSPITAL_COMMUNITY): Admission: RE | Admit: 2024-04-19

## 2024-05-05 ENCOUNTER — Ambulatory Visit (HOSPITAL_COMMUNITY)

## 2024-07-28 ENCOUNTER — Ambulatory Visit

## 2024-08-09 ENCOUNTER — Ambulatory Visit: Admitting: Student
